# Patient Record
Sex: Male | Born: 1995 | Race: Black or African American | Hispanic: No | Marital: Married | Smoking: Never smoker
Health system: Southern US, Community
[De-identification: ages and names within clinical notes are randomized; demographics above are authoritative.]

## PROBLEM LIST (undated history)

## (undated) DIAGNOSIS — J45909 Unspecified asthma, uncomplicated: Secondary | ICD-10-CM

## (undated) HISTORY — PX: NO PAST SURGERIES: SHX2092

---
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
## 2016-06-22 IMAGING — CR DG HUMERUS 2V *R*
2 series · 2 of 2 positions shown · non-contrast
Comparison: None.

CLINICAL DATA: No known injury, now with decreased range of motion
of the right arm. Initial encounter.

EXAM:
RIGHT HUMERUS - 2+ VIEW

[w humerus ap right *]
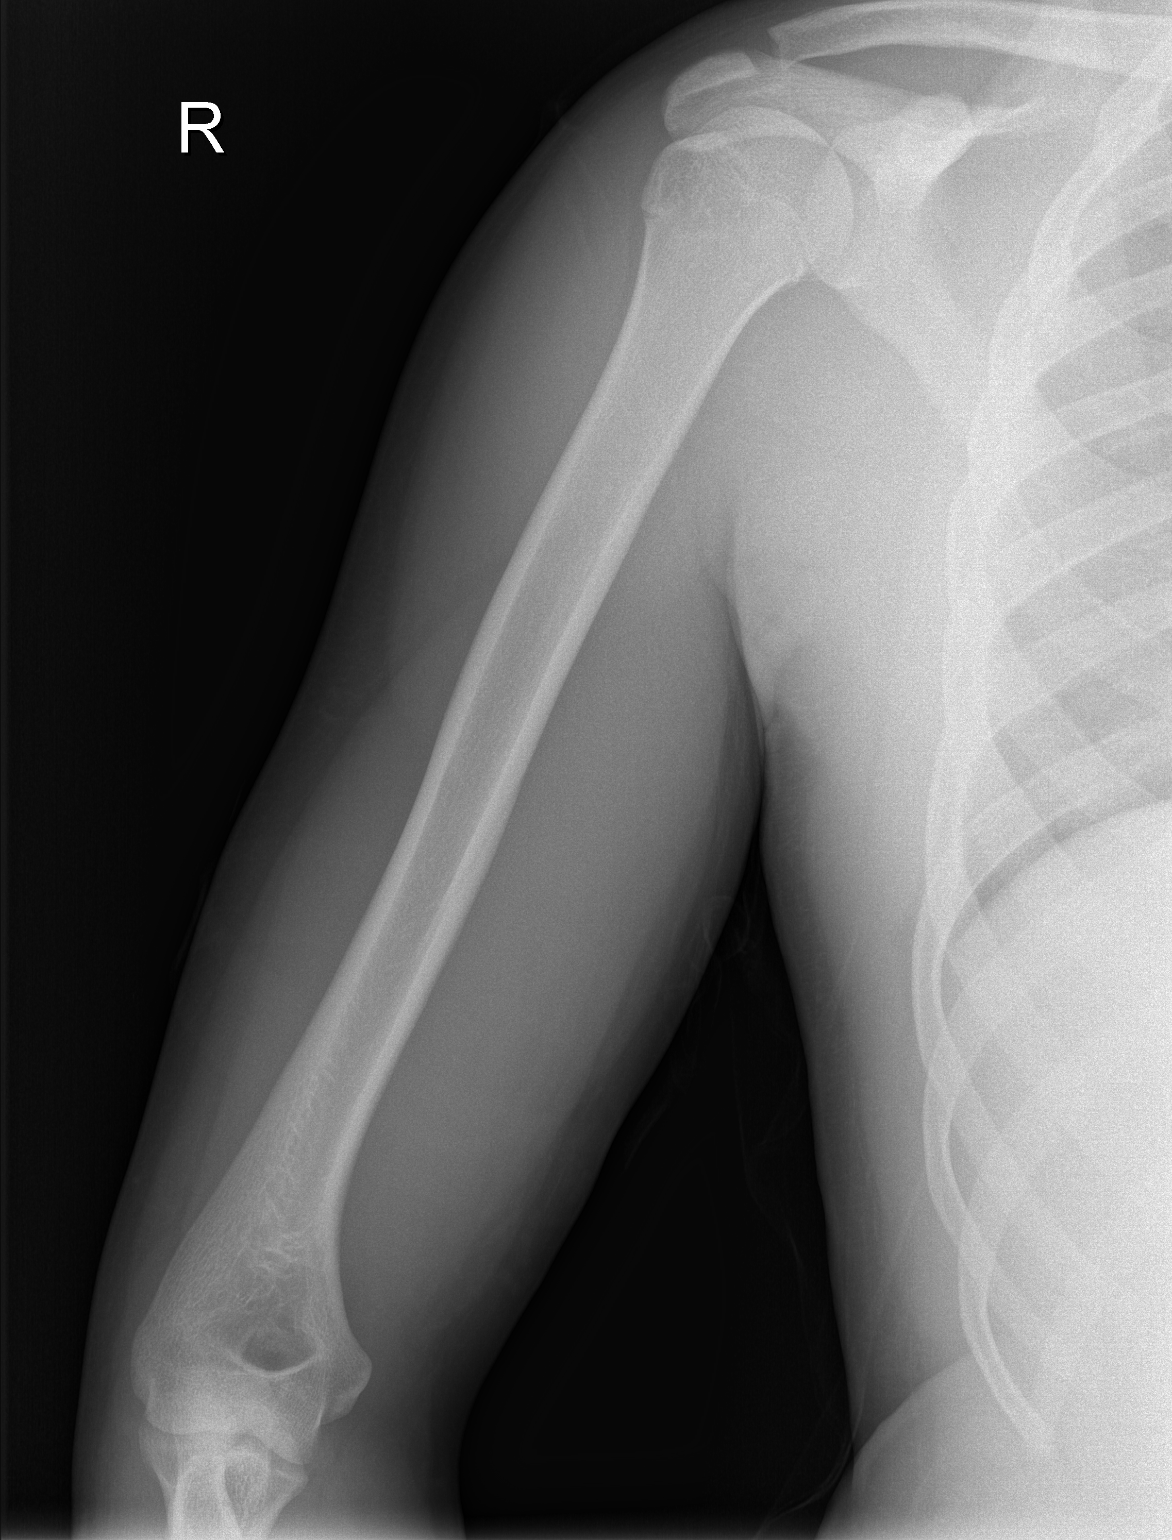

[w humerus lat right *]
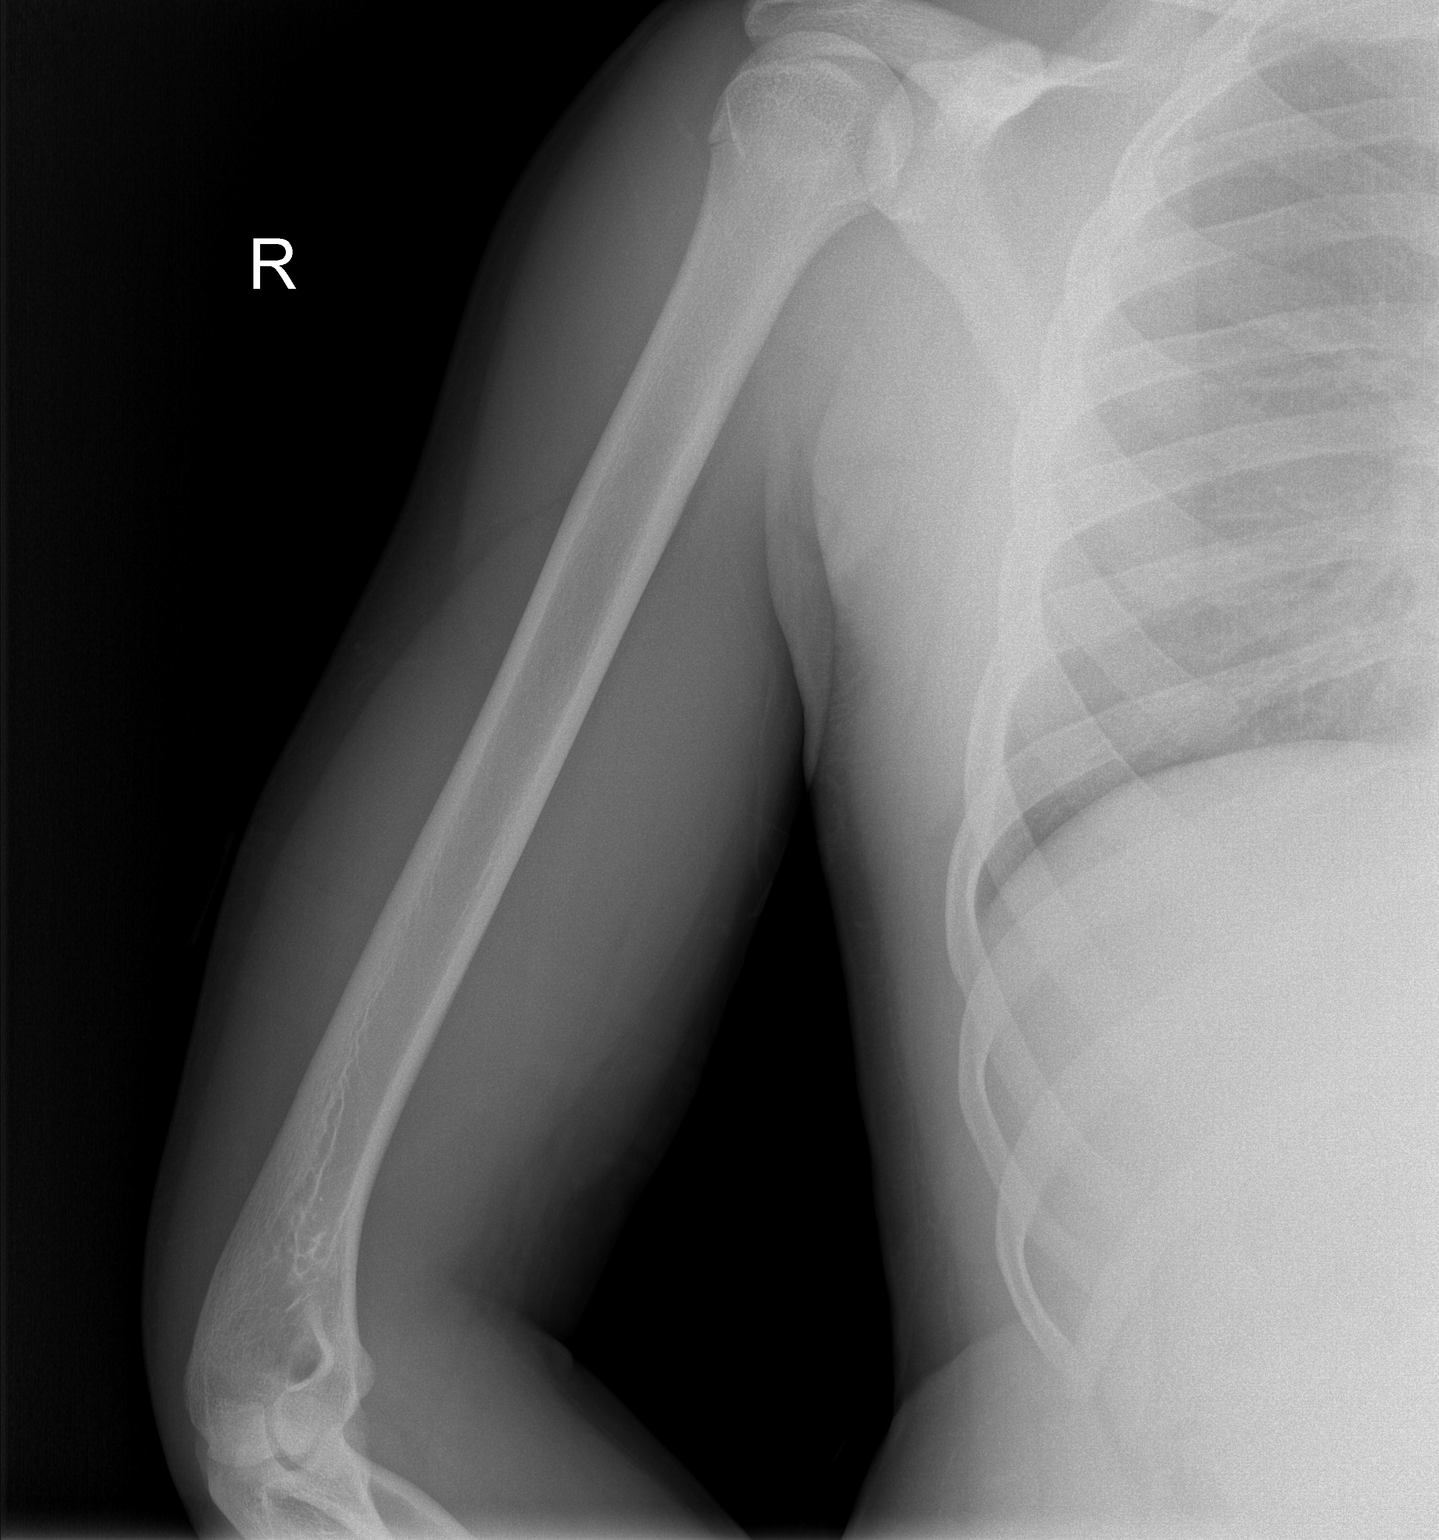

[2 of 2 positions shown; findings below may reference images not displayed]

FINDINGS: No fracture or dislocation. Regional soft tissues appear normal. No
radiopaque foreign body. Limited visualization of the adjacent elbow
and shoulder is normal given obliquity. Limited visualization
adjacent thorax is normal.
IMPRESSION: No acute findings.

## 2020-08-28 ENCOUNTER — Emergency Department (HOSPITAL_BASED_OUTPATIENT_CLINIC_OR_DEPARTMENT_OTHER)
Admission: EM | Admit: 2020-08-28 | Discharge: 2020-08-28 | Disposition: A | Discharge status: Home / Self Care | Payer: PRIVATE HEALTH INSURANCE | Attending: Emergency Medicine | Admitting: Emergency Medicine

## 2020-08-28 ENCOUNTER — Encounter (HOSPITAL_BASED_OUTPATIENT_CLINIC_OR_DEPARTMENT_OTHER): Payer: Self-pay | Admitting: Emergency Medicine

## 2020-08-28 ENCOUNTER — Other Ambulatory Visit: Payer: Self-pay

## 2020-08-28 ENCOUNTER — Emergency Department (HOSPITAL_BASED_OUTPATIENT_CLINIC_OR_DEPARTMENT_OTHER): Payer: PRIVATE HEALTH INSURANCE

## 2020-08-28 DIAGNOSIS — Z7951 Long term (current) use of inhaled steroids: Secondary | ICD-10-CM | POA: Insufficient documentation

## 2020-08-28 DIAGNOSIS — J45909 Unspecified asthma, uncomplicated: Secondary | ICD-10-CM | POA: Diagnosis not present

## 2020-08-28 DIAGNOSIS — R112 Nausea with vomiting, unspecified: Secondary | ICD-10-CM | POA: Diagnosis not present

## 2020-08-28 DIAGNOSIS — R109 Unspecified abdominal pain: Secondary | ICD-10-CM | POA: Insufficient documentation

## 2020-08-28 DIAGNOSIS — R197 Diarrhea, unspecified: Secondary | ICD-10-CM | POA: Insufficient documentation

## 2020-08-28 LAB — COMPREHENSIVE METABOLIC PANEL
ALT: 25 U/L (ref 0–44)
AST: 30 U/L (ref 15–41)
Albumin: 3.6 g/dL (ref 3.5–5.0)
Alkaline Phosphatase: 35 U/L — ABNORMAL LOW (ref 38–126)
Anion gap: 7 (ref 5–15)
BUN: 17 mg/dL (ref 6–20)
CO2: 29 mmol/L (ref 22–32)
Calcium: 8.8 mg/dL — ABNORMAL LOW (ref 8.9–10.3)
Chloride: 103 mmol/L (ref 98–111)
Creatinine, Ser: 1.08 mg/dL (ref 0.61–1.24)
GFR, Estimated: >60 mL/min (ref >60)
Glucose, Bld: 93 mg/dL (ref 70–99)
Potassium: 3.6 mmol/L (ref 3.5–5.1)
Sodium: 139 mmol/L (ref 135–145)
Total Bilirubin: 0.5 mg/dL (ref 0.3–1.2)
Total Protein: 6.5 g/dL (ref 6.5–8.1)

## 2020-08-28 LAB — CBC WITH DIFFERENTIAL/PLATELET
Abs Immature Granulocytes: 0.01 10*3/uL (ref 0.00–0.07)
Basophils Absolute: 0 10*3/uL (ref 0.0–0.1)
Basophils Relative: 1 %
Eosinophils Absolute: 0.2 10*3/uL (ref 0.0–0.5)
Eosinophils Relative: 4 %
HCT: 40.6 % (ref 39.0–52.0)
Hemoglobin: 13.9 g/dL (ref 13.0–17.0)
Immature Granulocytes: 0 %
Lymphocytes Relative: 50 %
Lymphs Abs: 2.2 10*3/uL (ref 0.7–4.0)
MCH: 31.8 pg (ref 26.0–34.0)
MCHC: 34.2 g/dL (ref 30.0–36.0)
MCV: 92.9 fL (ref 80.0–100.0)
Monocytes Absolute: 0.6 10*3/uL (ref 0.1–1.0)
Monocytes Relative: 14 %
Neutro Abs: 1.3 10*3/uL — ABNORMAL LOW (ref 1.7–7.7)
Neutrophils Relative %: 31 %
Platelets: 209 10*3/uL (ref 150–400)
RBC: 4.37 MIL/uL (ref 4.22–5.81)
RDW: 11.8 % (ref 11.5–15.5)
WBC: 4.3 10*3/uL (ref 4.0–10.5)
nRBC: 0 % (ref 0.0–0.2)

## 2020-08-28 LAB — LIPASE, BLOOD: Lipase: 23 U/L (ref 11–51)

## 2020-08-28 MED ORDER — IOHEXOL 300 MG/ML  SOLN
100.0000 mL | Freq: Once | INTRAMUSCULAR | Status: AC
  Administered 2020-08-28: 100 mL via INTRAVENOUS

## 2020-08-28 MED ORDER — ONDANSETRON HCL 4 MG/2ML IJ SOLN
4.0000 mg | Freq: Once | INTRAMUSCULAR | Status: AC
  Administered 2020-08-28: 4 mg via INTRAVENOUS
  Filled 2020-08-28: qty 2

## 2020-08-28 MED ORDER — LACTATED RINGERS IV BOLUS
1000.0000 mL | Freq: Once | INTRAVENOUS | Status: AC
  Administered 2020-08-28: 1000 mL via INTRAVENOUS

## 2020-08-28 NOTE — Discharge Instructions (Signed)
Nausea, Vomiting, and Diarrhea  Hand washing: Wash your hands throughout the day, but especially before and after touching the face, using the restroom, sneezing, coughing, or touching surfaces that have been coughed or sneezed upon. Hydration: Symptoms will be intensified and complicated by dehydration. Dehydration can also extend the duration of symptoms. Drink plenty of fluids and get plenty of rest. You should be drinking at least half a liter of water an hour to stay hydrated. Electrolyte drinks (ex. Gatorade, Powerade, Pedialyte) are also encouraged. You should be drinking enough fluids to make your urine light yellow, almost clear. If this is not the case, you are not drinking enough water. Please note that some of the treatments indicated below will not be effective if you are not adequately hydrated. Diet: Please concentrate on hydration, however, you may introduce food slowly.  Start with a clear liquid diet, progressed to a full liquid diet, and then bland solids as you are able. Pain or fever: Ibuprofen, Naproxen, or Tylenol for pain or fever.  Nausea/vomiting: Things that can help with nausea/vomiting include peppermint/menthol candies, vitamin B12, and ginger. Diarrhea: May use medications such as loperamide (Imodium) or Bismuth subsalicylate (Pepto-Bismol). Follow-up: Follow-up with a primary care provider on this matter. Return: Return should you develop a fever, bloody diarrhea, increased abdominal pain, uncontrolled vomiting, or any other major concerns.  For prescription assistance, may try using prescription discount sites or apps, such as goodrx.com

## 2020-08-28 NOTE — ED Triage Notes (Signed)
Reports having abdominal discomfort after eating mediterranean food on Friday.  Endorses n/v/d yesterday but none today.  Continues to not feel like eating.

## 2020-08-28 NOTE — ED Provider Notes (Addendum)
[facility] EMERGENCY DEPARTMENT Provider Note   CSN: [id number] Arrival date & time: 08/28/20  2016     History Chief Complaint  Patient presents with  . Abdominal Pain    Leroy Adkins is a 24 y.o. male.  HPI      Leroy Adkins is a 24 y.o. male, with a history of asthma, presenting to the ED with nausea, vomiting, diarrhea, and abdominal discomfort for the last 2 days.  Patient endorses multiple episodes of nausea and nonbloody nonbilious emesis as well as several episodes of diarrhea.  Right-sided abdominal cramping/discomfort, intermittent, nonradiating, moderate.  Accompanied by loss of appetite.  Denies fever/chills, hematochezia/melena, chest pain, shortness of breath, cough, groin pain/swelling, urinary symptoms, or any other complaints.  Past Medical History:  Diagnosis Date  . Asthma     Patient Active Problem List   Diagnosis Date Noted  . Ingrown nail 02/12/2014  . Pain in toe of right foot 02/12/2014  . Onychomycosis 02/12/2014    History reviewed. No pertinent surgical history.     No family history on file.  Social History   Tobacco Use  . Smoking status: Never Smoker  . Smokeless tobacco: Never Used  Substance Use Topics  . Alcohol use: No  . Drug use: No    Home Medications Prior to Admission medications   Medication Sig Start Date End Date Taking? Authorizing Provider  albuterol (PROVENTIL) (5 MG/ML) 0.5% nebulizer solution Take 2.5 mg by nebulization every 6 (six) hours as needed for wheezing or shortness of breath.    [provider]  Fluticasone-Salmeterol (ADVAIR DISKUS) 100-50 MCG/DOSE AEPB Inhale 1 puff into the lungs 2 (two) times daily.    [provider]  HYDROcodone-acetaminophen (NORCO/VICODIN) 5-325 MG per tablet Take 2 tablets by mouth every 4 (four) hours as needed. 07/18/14   Elson Areas, PA-C  ibuprofen (ADVIL,MOTRIN) 800 MG tablet Take 1 tablet (800 mg total) by mouth 3 (three) times daily.  07/18/14   Elson Areas, PA-C  naproxen (NAPROSYN) 500 MG tablet Take 1 tablet (500 mg total) by mouth 2 (two) times daily. 03/25/15   Renne Crigler, PA-C  terbinafine (LAMISIL) 250 MG tablet Take 1 tablet (250 mg total) by mouth daily. 02/12/14   Sheard, Myeong O, DPM    Allergies    Penicillins  Review of Systems   Review of Systems  Constitutional: Negative for chills, diaphoresis and fever.  Respiratory: Negative for cough and shortness of breath.   Cardiovascular: Negative for chest pain.  Gastrointestinal: Positive for abdominal pain, diarrhea, nausea and vomiting. Negative for blood in stool.  Genitourinary: Negative for dysuria, flank pain, hematuria, scrotal swelling and testicular pain.  Musculoskeletal: Negative for back pain.  All other systems reviewed and are negative.   Physical Exam Updated Vital Signs BP 137/64 (BP Location: Right Arm)   Pulse (!) 56   Temp 98 F (36.7 C) (Oral)   Resp 18   Ht 6\' 1"  (1.854 m)   Wt 104.2 kg   SpO2 98%   BMI 30.31 kg/m   Physical Exam Vitals and nursing note reviewed.  Constitutional:      General: He is not in acute distress.    Appearance: He is well-developed. He is not diaphoretic.  HENT:     Head: Normocephalic and atraumatic.     Mouth/Throat:     Mouth: Mucous membranes are moist.     Pharynx: Oropharynx is clear.  Eyes:     Conjunctiva/sclera: Conjunctivae normal.  Cardiovascular:     Rate and Rhythm: Normal rate and regular rhythm.     Pulses: Normal pulses.          Radial pulses are 2+ on the right side and 2+ on the left side.  Pulmonary:     Effort: Pulmonary effort is normal. No respiratory distress.  Abdominal:     General: Bowel sounds are increased.     Palpations: Abdomen is soft.     Tenderness: There is abdominal tenderness in the right lower quadrant. There is no guarding.  Musculoskeletal:     Cervical back: Neck supple.  Skin:    General: Skin is warm and dry.  Neurological:     Mental  Status: He is alert.  Psychiatric:        Mood and Affect: Mood and affect normal.        Speech: Speech normal.        Behavior: Behavior normal.     ED Results / Procedures / Treatments   Labs (all labs ordered are listed, but only abnormal results are displayed) Labs Reviewed  CBC WITH DIFFERENTIAL/PLATELET - Abnormal; Notable for the following components:      Result Value   Neutro Abs 1.3 (*)    All other components within normal limits  COMPREHENSIVE METABOLIC PANEL - Abnormal; Notable for the following components:   Calcium 8.8 (*)    Alkaline Phosphatase 35 (*)    All other components within normal limits  LIPASE, BLOOD    EKG None  Radiology CT ABDOMEN PELVIS W CONTRAST  Result Date: 08/28/2020 CLINICAL DATA:  Right lower quadrant pain. Concern for acute appendicitis. EXAM: CT ABDOMEN AND PELVIS WITH CONTRAST TECHNIQUE: Multidetector CT imaging of the abdomen and pelvis was performed using the standard protocol following bolus administration of intravenous contrast. CONTRAST:  OMNIPAQUE IOHEXOL 300 MG/ML  SOLN COMPARISON:  None. FINDINGS: Lower chest: The lung bases are clear. The heart size is normal. Hepatobiliary: The liver is normal. Cholelithiasis without acute inflammation.There is no biliary ductal dilation. Pancreas: Normal contours without ductal dilatation. No peripancreatic fluid collection. Spleen: Unremarkable. Adrenals/Urinary Tract: --Adrenal glands: Unremarkable. --Right kidney/ureter: No hydronephrosis or radiopaque kidney stones. --Left kidney/ureter: No hydronephrosis or radiopaque kidney stones. --Urinary bladder: Unremarkable. Stomach/Bowel: --Stomach/Duodenum: The stomach is significantly distended. --Small bowel: Unremarkable. --Colon: Unremarkable. --Appendix: Normal. Vascular/Lymphatic: Normal course and caliber of the major abdominal vessels. --No retroperitoneal lymphadenopathy. --No mesenteric lymphadenopathy. --No pelvic or inguinal  lymphadenopathy. Reproductive: Unremarkable Other: No ascites or free air. The abdominal wall is normal. Musculoskeletal. No acute displaced fractures. IMPRESSION: 1. No acute abdominopelvic abnormality. Specifically, the appendix is normal. 2. Cholelithiasis without acute inflammation. 3. Significantly distended stomach. Electronically Signed   By: Katherine Mantle M.D.   On: 08/28/2020 22:58    Procedures Procedures (including critical care time)  Medications Ordered in ED Medications  ondansetron (ZOFRAN) injection 4 mg (4 mg Intravenous Given 08/28/20 2048)  lactated ringers bolus 1,000 mL (0 mLs Intravenous Stopped 08/28/20 2317)  iohexol (OMNIPAQUE) 300 MG/ML solution 100 mL (100 mLs Intravenous Contrast Given 08/28/20 2206)    ED Course  I have reviewed the triage vital signs and the nursing notes.  Pertinent labs & imaging results that were available during my care of the patient were reviewed by me and considered in my medical decision making (see chart for details).  Clinical Course as of Nov 22 0001  Wynelle Link Aug 28, 2020  2100 Patient states he is a very fit person and  a pulse rate in this range would not be unusual.  Pulse Rate(!): 49 [SJ]  2305 No upper abdominal pain, tenderness, or obvious distension.  CT ABDOMEN PELVIS W CONTRAST [SJ]    Clinical Course User Index [SJ] Yamari Ventola C, PA-C   MDM Rules/Calculators/A&P                          Patient presents with instances of nausea, vomiting, and diarrhea over the past couple days.  Some intermittent abdominal discomfort. Patient is nontoxic appearing, afebrile, not tachycardic, not tachypneic, not hypotensive, maintains excellent SPO2 on room air, and is in no apparent distress.    I reviewed and interpreted the patient's labs and radiological studies. No leukocytosis.  Lab work overall reassuring. CT without acute findings pertinent to the patient's current complaint.,  Although all noted findings were discussed  with the patient.  The patient was given instructions for home care as well as return precautions. Patient voices understanding of these instructions, accepts the plan, and is comfortable with discharge.  Patient was offered prescriptions for symptom management at time of discharge, but declined.  Final Clinical Impression(s) / ED Diagnoses Final diagnoses:  Nausea vomiting and diarrhea    Rx / DC Orders ED Discharge Orders    None       Concepcion Living 08/29/20 0001    Anselm Pancoast, PA-C 08/29/20 0002    Charlynne Pander, MD 08/31/20 [phone]

## 2021-04-03 ENCOUNTER — Other Ambulatory Visit: Payer: Self-pay

## 2021-04-03 ENCOUNTER — Emergency Department (HOSPITAL_BASED_OUTPATIENT_CLINIC_OR_DEPARTMENT_OTHER)
Admission: EM | Admit: 2021-04-03 | Discharge: 2021-04-03 | Disposition: A | Discharge status: Home / Self Care | Payer: PRIVATE HEALTH INSURANCE | Attending: Emergency Medicine | Admitting: Emergency Medicine

## 2021-04-03 ENCOUNTER — Encounter (HOSPITAL_BASED_OUTPATIENT_CLINIC_OR_DEPARTMENT_OTHER): Payer: Self-pay | Admitting: Emergency Medicine

## 2021-04-03 DIAGNOSIS — Z20822 Contact with and (suspected) exposure to covid-19: Secondary | ICD-10-CM | POA: Insufficient documentation

## 2021-04-03 DIAGNOSIS — J45909 Unspecified asthma, uncomplicated: Secondary | ICD-10-CM | POA: Diagnosis not present

## 2021-04-03 DIAGNOSIS — Z7951 Long term (current) use of inhaled steroids: Secondary | ICD-10-CM | POA: Insufficient documentation

## 2021-04-03 DIAGNOSIS — R059 Cough, unspecified: Secondary | ICD-10-CM | POA: Diagnosis present

## 2021-04-03 DIAGNOSIS — B349 Viral infection, unspecified: Secondary | ICD-10-CM | POA: Diagnosis not present

## 2021-04-03 DIAGNOSIS — Z28310 Unvaccinated for covid-19: Secondary | ICD-10-CM | POA: Insufficient documentation

## 2021-04-03 DIAGNOSIS — R630 Anorexia: Secondary | ICD-10-CM | POA: Insufficient documentation

## 2021-04-03 LAB — RESP PANEL BY RT-PCR (FLU A&B, COVID) ARPGX2
Influenza A by PCR: NEGATIVE
Influenza B by PCR: NEGATIVE
SARS Coronavirus 2 by RT PCR: NEGATIVE

## 2021-04-03 MED ORDER — ALBUTEROL SULFATE HFA 108 (90 BASE) MCG/ACT IN AERS
2.0000 | INHALATION_SPRAY | RESPIRATORY_TRACT | Status: DC | PRN
  Administered 2021-04-03: 2 via RESPIRATORY_TRACT
  Filled 2021-04-03: qty 6.7

## 2021-04-03 NOTE — ED Provider Notes (Signed)
[facility] EMERGENCY DEPARTMENT Provider Note   CSN: [id number] Arrival date & time: 04/03/21  1127     History Chief Complaint  Patient presents with   Cough    Leroy Adkins is a 25 y.o. male.  The history is provided by the patient. No language interpreter was used.  Cough  25 year old male significant history of asthma who presents with cold symptoms.  Patient report for the past 3 days he has had some generalized fatigue, dry cough, decrease in appetite, and shortness of breath.  Symptom is mild in severity but patient has not been vaccinated for COVID-19 and would like to be tested.  He denies any fever, loss of taste or smell, nausea vomiting or diarrhea abdominal pain or back pain.  He does have history of asthma but have not been using his inhaler.  He is anxious to get back to work  Past Medical History:  Diagnosis Date   Asthma     Patient Active Problem List   Diagnosis Date Noted   Ingrown nail 02/12/2014   Pain in toe of right foot 02/12/2014   Onychomycosis 02/12/2014    History reviewed. No pertinent surgical history.     History reviewed. No pertinent family history.  Social History   Tobacco Use   Smoking status: Never   Smokeless tobacco: Never  Substance Use Topics   Alcohol use: No   Drug use: No    Home Medications Prior to Admission medications   Medication Sig Start Date End Date Taking? Authorizing Provider  albuterol (PROVENTIL) (5 MG/ML) 0.5% nebulizer solution Take 2.5 mg by nebulization every 6 (six) hours as needed for wheezing or shortness of breath.    [provider]  Fluticasone-Salmeterol (ADVAIR DISKUS) 100-50 MCG/DOSE AEPB Inhale 1 puff into the lungs 2 (two) times daily.    [provider]  HYDROcodone-acetaminophen (NORCO/VICODIN) 5-325 MG per tablet Take 2 tablets by mouth every 4 (four) hours as needed. 07/18/14   Elson Areas, PA-C  ibuprofen (ADVIL,MOTRIN) 800 MG tablet Take 1 tablet (800  mg total) by mouth 3 (three) times daily. 07/18/14   Elson Areas, PA-C  naproxen (NAPROSYN) 500 MG tablet Take 1 tablet (500 mg total) by mouth 2 (two) times daily. 03/25/15   Renne Crigler, PA-C  terbinafine (LAMISIL) 250 MG tablet Take 1 tablet (250 mg total) by mouth daily. 02/12/14   Sheard, Myeong O, DPM    Allergies    Penicillins  Review of Systems   Review of Systems  Respiratory:  Positive for cough.   All other systems reviewed and are negative.  Physical Exam Updated Vital Signs BP (!) 144/64 (BP Location: Left Arm)   Pulse 65   Temp 98.3 F (36.8 C) (Oral)   Resp 20   Ht 6\' 4"  (1.93 m)   Wt 108.9 kg   SpO2 99%   BMI 29.21 kg/m   Physical Exam Vitals and nursing note reviewed.  Constitutional:      General: He is not in acute distress.    Appearance: He is well-developed.  HENT:     Head: Atraumatic.     Right Ear: Tympanic membrane normal.     Left Ear: Tympanic membrane normal.     Nose: Nose normal.     Mouth/Throat:     Mouth: Mucous membranes are moist.  Eyes:     Conjunctiva/sclera: Conjunctivae normal.  Cardiovascular:     Rate and Rhythm: Normal rate and regular rhythm.  Pulses: Normal pulses.     Heart sounds: Normal heart sounds.  Pulmonary:     Effort: Pulmonary effort is normal.     Breath sounds: Normal breath sounds. No wheezing, rhonchi or rales.  Abdominal:     Palpations: Abdomen is soft.     Tenderness: There is no abdominal tenderness.  Musculoskeletal:     Cervical back: Normal range of motion and neck supple.  Skin:    Findings: No rash.  Neurological:     Mental Status: He is alert. Mental status is at baseline.    ED Results / Procedures / Treatments   Labs (all labs ordered are listed, but only abnormal results are displayed) Labs Reviewed  RESP PANEL BY RT-PCR (FLU A&B, COVID) [id number]    EKG None  Radiology No results found.  Procedures Procedures   Medications Ordered in ED Medications - No data to  display  ED Course  I have reviewed the triage vital signs and the nursing notes.  Pertinent labs & imaging results that were available during my care of the patient were reviewed by me and considered in my medical decision making (see chart for details).    MDM Rules/Calculators/A&P                          BP (!) 144/64 (BP Location: Left Arm)   Pulse 65   Temp 98.3 F (36.8 C) (Oral)   Resp 20   Ht 6\' 4"  (1.93 m)   Wt 108.9 kg   SpO2 99%   BMI 29.21 kg/m   Final Clinical Impression(s) / ED Diagnoses Final diagnoses:  Viral illness    Rx / DC Orders ED Discharge Orders     None      2:17 PM Patient here with cold symptoms for the past 3 days.  Flu and COVID test today is negative.  Patient overall well-appearing no hypoxia, breathing normally and no obvious wheezes.  He does have history of asthma and states that he needs inhaler, will provide inhaler.  Patient otherwise stable for discharge.  Lungs are clear doubt pneumonia.  Leroy Adkins was evaluated in Emergency Department on 04/03/2021 for the symptoms described in the history of present illness. He was evaluated in the context of the global COVID-19 pandemic, which necessitated consideration that the patient might be at risk for infection with the SARS-CoV-2 virus that causes COVID-19. Institutional protocols and algorithms that pertain to the evaluation of patients at risk for COVID-19 are in a state of rapid change based on information released by regulatory bodies including the CDC and federal and state organizations. These policies and algorithms were followed during the patient's care in the ED.    04/05/2021, PA-C 04/03/21 1419    04/05/21, MD 04/03/21 [phone]

## 2021-04-03 NOTE — Discharge Instructions (Addendum)
Your flu and COVID test today are negative.  Symptoms are suggestive of a viral illness.  Use albuterol inhaler as needed.  Return if you have any concern.

## 2021-04-03 NOTE — ED Triage Notes (Signed)
Pt c/o cough, sore throat, bodyaches, and SOB since Saturday. Resp e/u in triage 98% RA

## 2022-03-20 ENCOUNTER — Other Ambulatory Visit: Payer: Self-pay

## 2022-03-20 ENCOUNTER — Encounter (HOSPITAL_BASED_OUTPATIENT_CLINIC_OR_DEPARTMENT_OTHER): Payer: Self-pay | Admitting: Emergency Medicine

## 2022-03-20 ENCOUNTER — Emergency Department (HOSPITAL_BASED_OUTPATIENT_CLINIC_OR_DEPARTMENT_OTHER)
Admission: EM | Admit: 2022-03-20 | Discharge: 2022-03-20 | Disposition: A | Discharge status: Home / Self Care | Payer: BLUE CROSS/BLUE SHIELD | Attending: Emergency Medicine | Admitting: Emergency Medicine

## 2022-03-20 DIAGNOSIS — Z7952 Long term (current) use of systemic steroids: Secondary | ICD-10-CM | POA: Insufficient documentation

## 2022-03-20 DIAGNOSIS — R590 Localized enlarged lymph nodes: Secondary | ICD-10-CM | POA: Diagnosis not present

## 2022-03-20 DIAGNOSIS — J4531 Mild persistent asthma with (acute) exacerbation: Secondary | ICD-10-CM | POA: Diagnosis not present

## 2022-03-20 DIAGNOSIS — R0602 Shortness of breath: Secondary | ICD-10-CM | POA: Diagnosis present

## 2022-03-20 MED ORDER — ALBUTEROL SULFATE HFA 108 (90 BASE) MCG/ACT IN AERS: 2.0000 | INHALATION_SPRAY | RESPIRATORY_TRACT | 1 refills | Status: DC | PRN

## 2022-03-20 MED ORDER — ALBUTEROL SULFATE (2.5 MG/3ML) 0.083% IN NEBU
INHALATION_SOLUTION | RESPIRATORY_TRACT | Status: AC
  Filled 2022-03-20: qty 3

## 2022-03-20 MED ORDER — IPRATROPIUM-ALBUTEROL 0.5-2.5 (3) MG/3ML IN SOLN
3.0000 mL | RESPIRATORY_TRACT | Status: AC
  Administered 2022-03-20: 3 mL via RESPIRATORY_TRACT

## 2022-03-20 MED ORDER — ALBUTEROL SULFATE (2.5 MG/3ML) 0.083% IN NEBU
2.5000 mg | INHALATION_SOLUTION | Freq: Once | RESPIRATORY_TRACT | Status: AC
  Administered 2022-03-20: 2.5 mg via RESPIRATORY_TRACT

## 2022-03-20 MED ORDER — PREDNISONE 10 MG PO TABS: 40.0000 mg | ORAL_TABLET | Freq: Every day | ORAL | 0 refills | Status: DC

## 2022-03-20 MED ORDER — AZITHROMYCIN 250 MG PO TABS: 250.0000 mg | ORAL_TABLET | Freq: Every day | ORAL | 0 refills | Status: DC

## 2022-03-20 MED ORDER — IPRATROPIUM-ALBUTEROL 0.5-2.5 (3) MG/3ML IN SOLN
RESPIRATORY_TRACT | Status: AC
  Filled 2022-03-20: qty 3

## 2022-03-20 NOTE — ED Provider Notes (Signed)
[facility] EMERGENCY DEPARTMENT Provider Note   CSN: [id number] Arrival date & time: 03/20/22  5170     History  Chief Complaint  Patient presents with   URI    Leroy Adkins is a 26 y.o. male.  The history is provided by the patient.  URI Leroy Adkins is a 26 y.o. male who presents to the Emergency Department complaining of short of breath.  He has a history of asthma and states that he has been experiencing increased asthma issues since staying in his mother's basement the last few months.  He has been experiencing increased shortness of breath and has been needing to use his albuterol inhaler more frequently.  Last night his symptoms became severe and he went upstairs to try and sleep.  He has associated dry cough.  He does report 2 days of headaches.  He also reports 3 days of pain to the left angle of the mandible that is worse when he lays on that area.  No significant ear pain, sore throat, fever, nausea, vomiting.  No known sick contacts.     Home Medications Prior to Admission medications   Medication Sig Start Date End Date Taking? Authorizing Provider  albuterol (VENTOLIN HFA) 108 (90 Base) MCG/ACT inhaler Inhale into the lungs every 6 (six) hours as needed for wheezing or shortness of breath.   Yes [provider]  albuterol (VENTOLIN HFA) 108 (90 Base) MCG/ACT inhaler Inhale 2 puffs into the lungs every 4 (four) hours as needed for wheezing or shortness of breath. 03/20/22  Yes Tilden Fossa, MD  azithromycin (ZITHROMAX) 250 MG tablet Take 1 tablet (250 mg total) by mouth daily. Take first 2 tablets together, then 1 every day until finished. 03/20/22  Yes Tilden Fossa, MD  predniSONE (DELTASONE) 10 MG tablet Take 4 tablets (40 mg total) by mouth daily. 03/20/22  Yes Tilden Fossa, MD      Allergies    Penicillins    Review of Systems   Review of Systems  All other systems reviewed and are negative.   Physical Exam Updated Vital Signs BP (!)  142/80   Pulse (!) 59   Temp 97.7 F (36.5 C) (Oral)   Resp 16   Ht 6\' 4"  (1.93 m)   Wt 108.9 kg   SpO2 97%   BMI 29.22 kg/m  Physical Exam Vitals and nursing note reviewed.  Constitutional:      Appearance: He is well-developed.  HENT:     Head: Normocephalic and atraumatic.     Comments: There is tenderness to palpation and one enlarged lymph node at the left lateral neck just posterior to the angle of the mandible.  No mastoid tenderness or swelling.  No swelling in the posterior oropharynx.  Good dentition.    Right Ear: Tympanic membrane normal.     Left Ear: Tympanic membrane normal.  Cardiovascular:     Rate and Rhythm: Normal rate and regular rhythm.     Heart sounds: No murmur heard. Pulmonary:     Effort: Pulmonary effort is normal. No respiratory distress.     Comments: Decreased air movement bilaterally Musculoskeletal:        General: No tenderness.  Skin:    General: Skin is warm and dry.  Neurological:     Mental Status: He is alert and oriented to person, place, and time.  Psychiatric:        Behavior: Behavior normal.     ED Results / Procedures / Treatments  Labs (all labs ordered are listed, but only abnormal results are displayed) Labs Reviewed - No data to display  EKG None  Radiology No results found.  Procedures Procedures    Medications Ordered in ED Medications  ipratropium-albuterol (DUONEB) 0.5-2.5 (3) MG/3ML nebulizer solution 3 mL (3 mLs Nebulization Given 03/20/22 0524)  albuterol (PROVENTIL) (2.5 MG/3ML) 0.083% nebulizer solution 2.5 mg (2.5 mg Nebulization Given 03/20/22 0524)    ED Course/ Medical Decision Making/ A&P                           Medical Decision Making Risk Prescription drug management.   Patient with history of asthma here for evaluation of shortness of breath as well as pain to the left side of his neck, face.  In terms of his asthma, patient with decreased air movement, scattered wheezes on ED  presentation without respiratory distress.  He was treated with 1 nebulizer with resolution of his symptoms.  Repeat lung exam with good air movement bilaterally, no respiratory distress.  In terms of his asthma we will treat with prednisone burst.  Suspect there is an allergic component as well, recommend that he start OTC allergy medication.  In terms of the pain to his left neck/face-no evidence of mastoiditis, otitis media, dental infection.  Given local tenderness and pain will start antibiotics for possible early infection.  He does have a penicillin allergy.  Discussed importance of outpatient follow-up as well as return precautions for any progressive or new concerning symptoms in terms of his pain.       Final Clinical Impression(s) / ED Diagnoses Final diagnoses:  Mild persistent asthma with exacerbation  Cervical lymphadenopathy    Rx / DC Orders ED Discharge Orders          Ordered    predniSONE (DELTASONE) 10 MG tablet  Daily        03/20/22 0610    albuterol (VENTOLIN HFA) 108 (90 Base) MCG/ACT inhaler  Every 4 hours PRN        03/20/22 0610    azithromycin (ZITHROMAX) 250 MG tablet  Daily        03/20/22 0610              Tilden Fossa, MD 03/20/22 [phone]

## 2022-03-20 NOTE — Discharge Instructions (Signed)
You can take Tylenol or ibuprofen, available over-the-counter according label instructions as needed for pain.  You may start taking Zyrtec(cetirizine), available over-the-counter for allergy relief.

## 2022-03-20 NOTE — ED Triage Notes (Addendum)
Pt c/o shob, left side of face swollen and cough

## 2022-08-03 IMAGING — CT CT ABD-PELV W/ CM
2 of 4 series · 16 of 46 positions shown, 18 images · IV contrast (Omnipaque)
Comparison: None.

CLINICAL DATA: Right lower quadrant pain. Concern for acute
appendicitis.

EXAM:
CT ABDOMEN AND PELVIS WITH CONTRAST
TECHNIQUE: Multidetector CT imaging of the abdomen and pelvis was performed
using the standard protocol following bolus administration of
intravenous contrast.
CONTRAST:  100mL OMNIPAQUE IOHEXOL 300 MG/ML  SOLN

[Series 2: axial st · axial · 0.90mm/px · z∈[+648,+1058]mm · 13 of 90 slices shown, 15 images]
[im 4/90  soft-tissue]
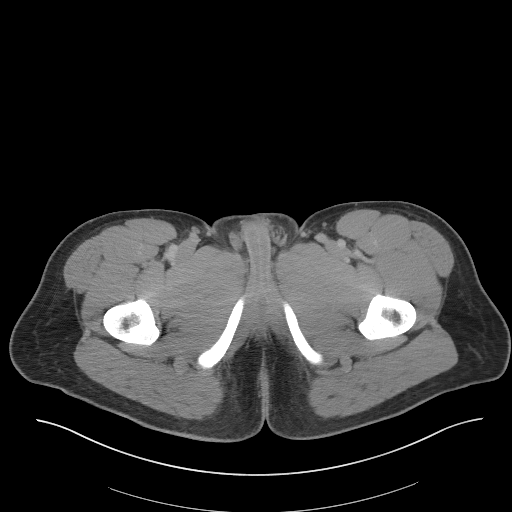
[im 4/90  bone]
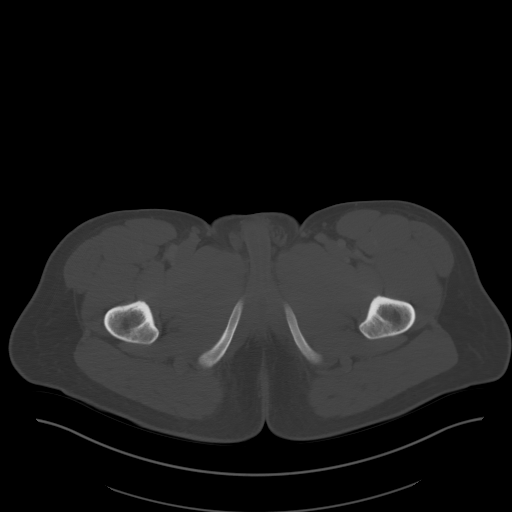
[im 12/90  soft-tissue]
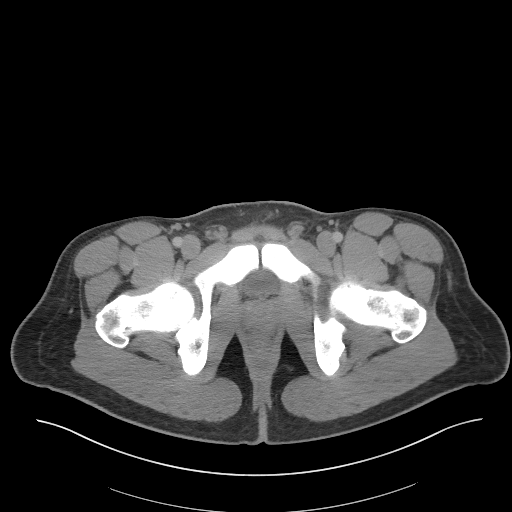
[im 19/90  soft-tissue]
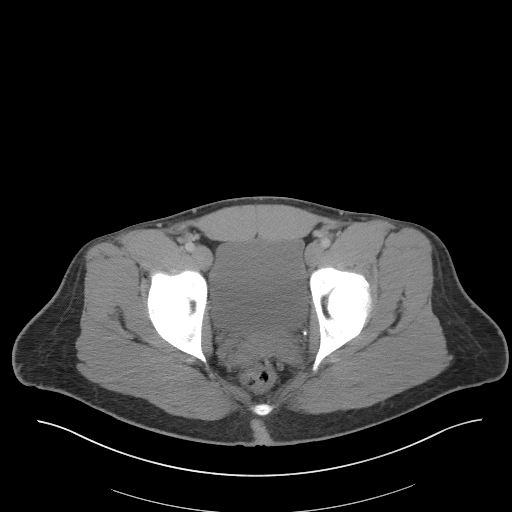
[im 26/90  soft-tissue]
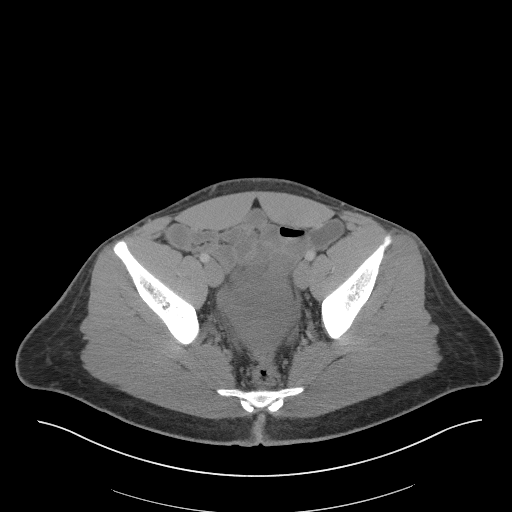
[im 30/90  soft-tissue]
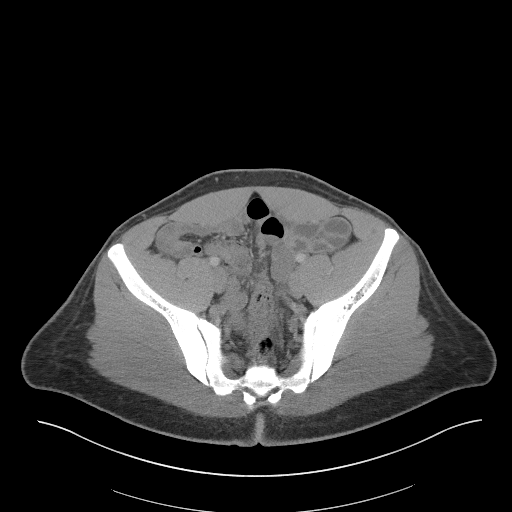
[im 38/90  soft-tissue]
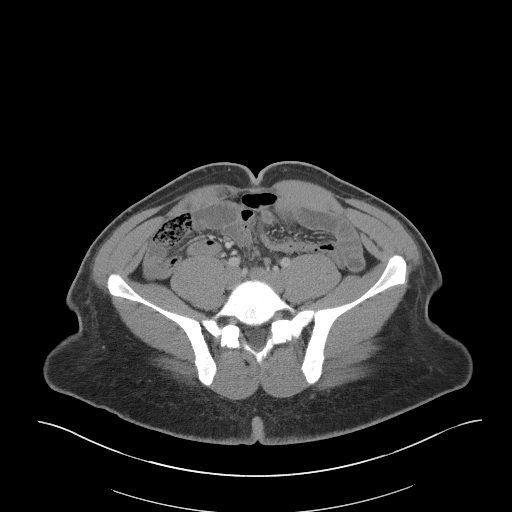
[im 45/90  soft-tissue]
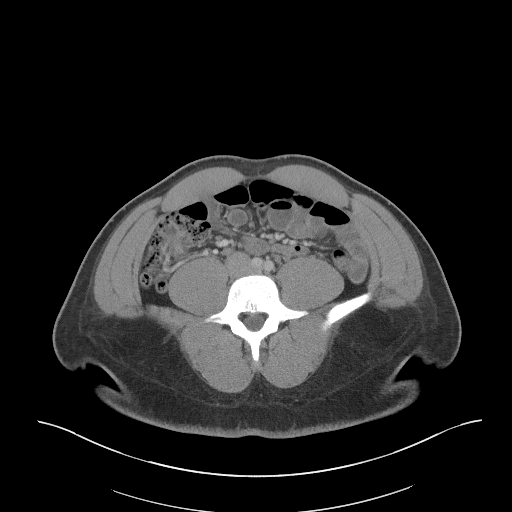
[im 52/90  soft-tissue]
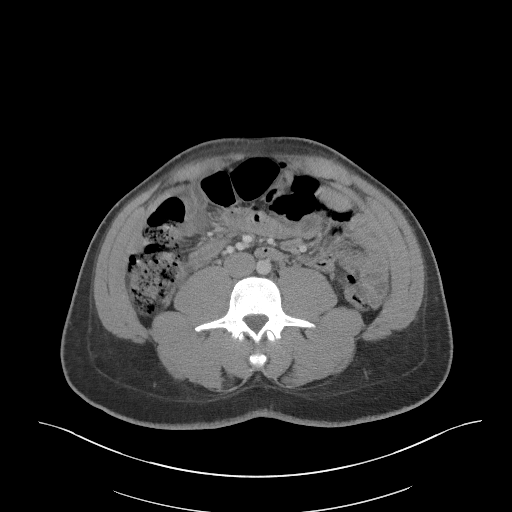
[im 60/90  soft-tissue]
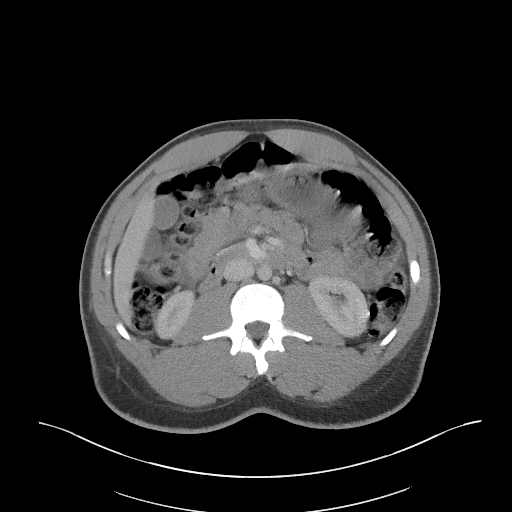
[im 60/90  bone]
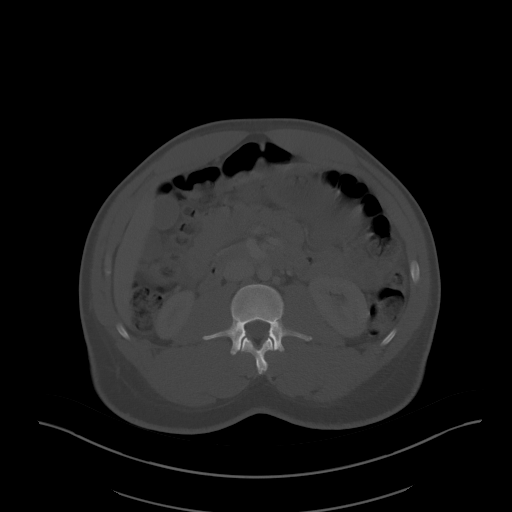
[im 64/90  soft-tissue]
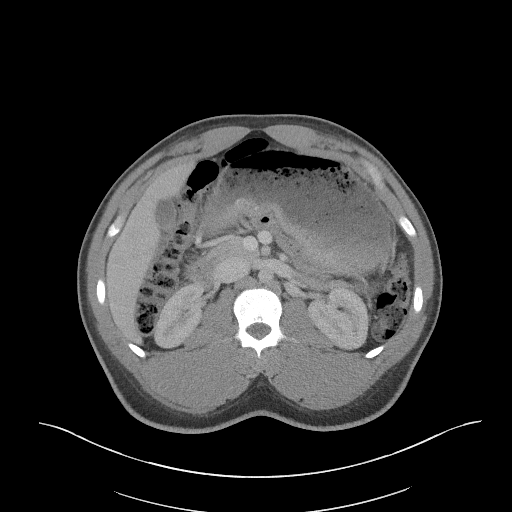
[im 71/90  soft-tissue]
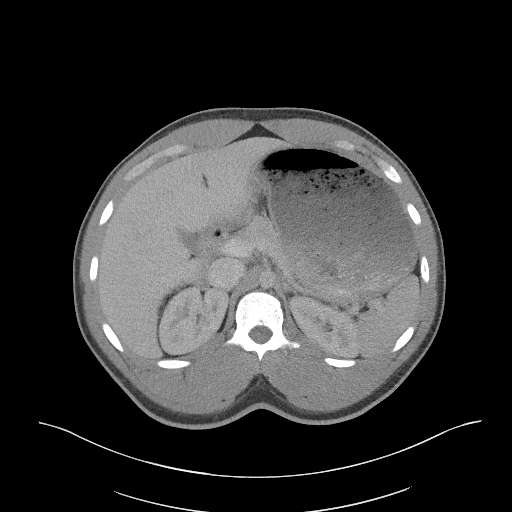
[im 78/90  soft-tissue]
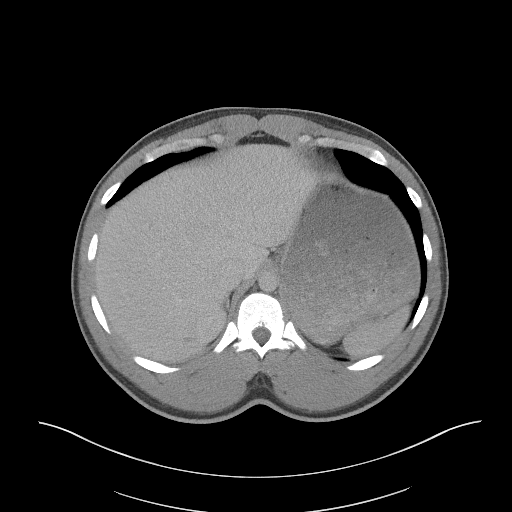
[im 86/90  soft-tissue]
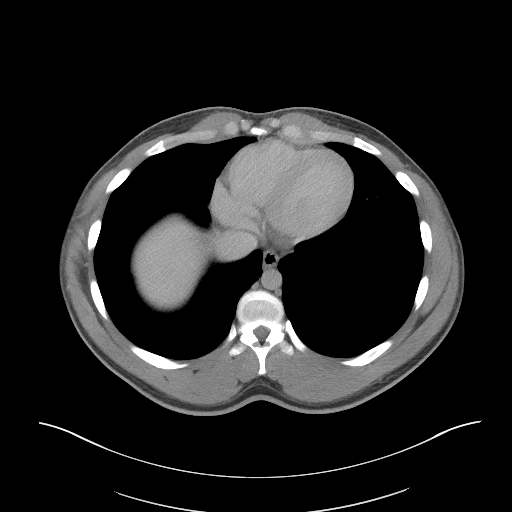

[Series 5: coronal st · coronal · 0.79mm/px · 3 of 106 slices shown]
[im 36/106  soft-tissue]
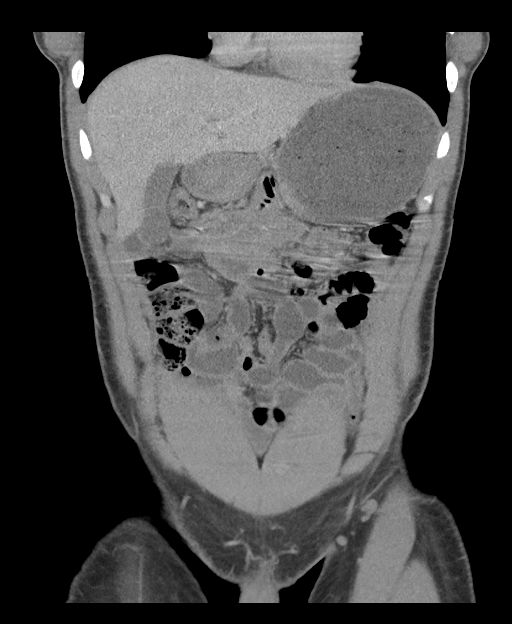
[im 47/106  soft-tissue]
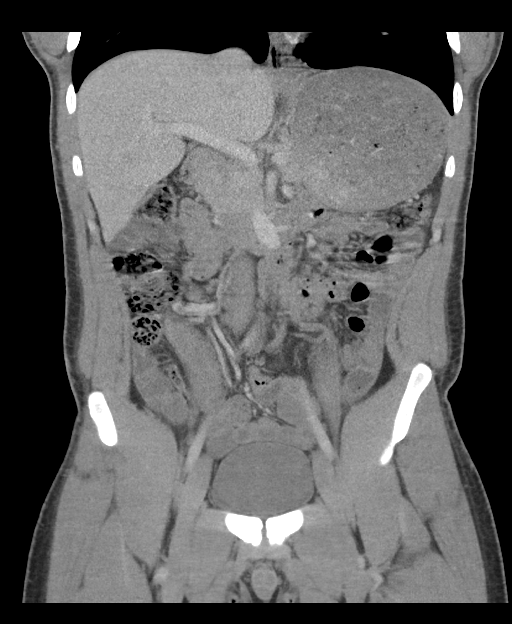
[im 59/106  soft-tissue]
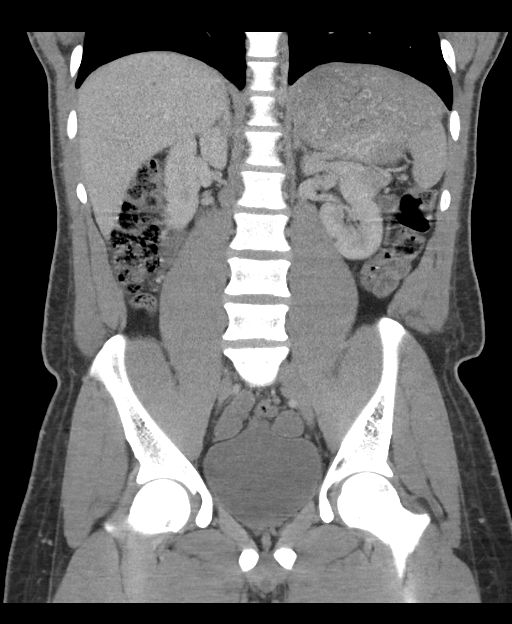

[16 of 46 positions shown; findings below may reference images not displayed]

FINDINGS: Lower chest: The lung bases are clear. The heart size is normal.

Hepatobiliary: The liver is normal. Cholelithiasis without acute
inflammation.There is no biliary ductal dilation.

Pancreas: Normal contours without ductal dilatation. No
peripancreatic fluid collection.

Spleen: Unremarkable.

Adrenals/Urinary Tract:

--Adrenal glands: Unremarkable.

--Right kidney/ureter: No hydronephrosis or radiopaque kidney
stones.

--Left kidney/ureter: No hydronephrosis or radiopaque kidney stones.

--Urinary bladder: Unremarkable.

Stomach/Bowel:

--Stomach/Duodenum: The stomach is significantly distended.

--Small bowel: Unremarkable.

--Colon: Unremarkable.

--Appendix: Normal.

Vascular/Lymphatic: Normal course and caliber of the major abdominal
vessels.

--No retroperitoneal lymphadenopathy.

--No mesenteric lymphadenopathy.

--No pelvic or inguinal lymphadenopathy.

Reproductive: Unremarkable

Other: No ascites or free air. The abdominal wall is normal.

Musculoskeletal. No acute displaced fractures.
IMPRESSION: 1. No acute abdominopelvic abnormality. Specifically, the appendix
is normal.
2. Cholelithiasis without acute inflammation.
3. Significantly distended stomach.

## 2024-03-04 DIAGNOSIS — J4520 Mild intermittent asthma, uncomplicated: Secondary | ICD-10-CM | POA: Diagnosis not present

## 2024-05-25 DIAGNOSIS — F4322 Adjustment disorder with anxiety: Secondary | ICD-10-CM | POA: Diagnosis not present

## 2024-05-31 DIAGNOSIS — F4322 Adjustment disorder with anxiety: Secondary | ICD-10-CM | POA: Diagnosis not present

## 2024-06-03 ENCOUNTER — Encounter: Payer: Self-pay | Admitting: Family Medicine

## 2024-06-03 ENCOUNTER — Ambulatory Visit: Admitting: Family Medicine

## 2024-06-03 VITALS — BP 138/88 | HR 78 | Resp 18 | Ht 76.0 in | Wt 314.0 lb

## 2024-06-03 DIAGNOSIS — Z23 Encounter for immunization: Secondary | ICD-10-CM

## 2024-06-03 DIAGNOSIS — Z0000 Encounter for general adult medical examination without abnormal findings: Secondary | ICD-10-CM | POA: Diagnosis not present

## 2024-06-03 DIAGNOSIS — Z114 Encounter for screening for human immunodeficiency virus [HIV]: Secondary | ICD-10-CM

## 2024-06-03 DIAGNOSIS — L910 Hypertrophic scar: Secondary | ICD-10-CM | POA: Diagnosis not present

## 2024-06-03 DIAGNOSIS — Z1159 Encounter for screening for other viral diseases: Secondary | ICD-10-CM

## 2024-06-03 DIAGNOSIS — M7652 Patellar tendinitis, left knee: Secondary | ICD-10-CM

## 2024-06-03 MED ORDER — ALBUTEROL SULFATE HFA 108 (90 BASE) MCG/ACT IN AERS: 2.0000 | INHALATION_SPRAY | RESPIRATORY_TRACT | 1 refills | Status: AC | PRN

## 2024-06-03 NOTE — Patient Instructions (Addendum)
 Give us  2-3 business days to get the results of your labs back.   Keep the diet clean and stay active.  If you do not hear anything about your referral in the next 1-2 weeks, call our office and ask for an update.  I recommend getting the flu shot in mid October. This suggestion would change if the CDC comes out with a different recommendation.   Do monthly self testicular checks in the shower. You are feeling for lumps/bumps that don't belong. If you feel anything like this, let me know!  Please get me a copy of your advanced directive form at your convenience.   Let us  know if you need anything.  Patellar Tendinitis Rehab Ask your health care provider which exercises are safe for you. Do exercises exactly as told by your health care provider and adjust them as directed. It is normal to feel mild stretching, pulling, tightness, or discomfort as you do these exercises, but you should stop right away if you feel sudden pain or your pain gets worse. Do not begin these exercises until told by your health care provider. Stretching and range of motion exercises This exercise warms up your muscles and joints and improves the movement and flexibility of your knee. This exercise also helps to relieve pain and stiffness. Exercise A: Hamstring, doorway  Lie on your back in front of a doorway with your R leg resting against the wall and your other leg flat on the floor in the doorway. There should be a slight bend in your knee. Straighten your knee. You should feel a stretch behind your knee or thigh. If you do not, scoot your buttocks closer to the door. Hold this position for 30 seconds. Repeat2 times. Complete this stretch 3 times per week. Strengthening exercises These exercises build strength and endurance in your knee. Endurance is the ability to use your muscles for a long time, even after they get tired. Exercise B: Quadriceps, isometric  Lie on your back with your R leg extended and your  other knee bent. Slowly tense the muscles in the front of your  thigh. When you do this, you should see your kneecap slide up toward your hip or see increased dimpling just above the knee. This motion will push the back of your knee toward the floor. If this is painful, try putting a rolled-up hand towel under your knee to support it in a bent position. Change the size of the towel to find a position that allows you to do this exercise without any pain. For 3 seconds, hold the muscle as tight as you can without increasing your pain. Relax the muscles slowly and completely. Repeat 2 times. Complete this exercise 3 times per week. Exercise C: Straight leg raises ( quadriceps) Lie on your back with your R leg extended and your other knee bent. Tense the muscles in the front of your R thigh. When you do this, you should see your kneecap slide up or see increased dimpling just above the knee. Keep these muscles tight as you raise your leg 4-6 inches (10-15 cm) off the floor. Do not let your moving knee bend. Hold this position for 1 second. Keep these muscles tense as you slowly lower your leg. Relax your muscles slowly and completely. Repeat 2 times. Complete this exercise 3 times per week Exercise D: Squats Stand in front of a table, with your feet and knees pointing straight ahead. You may rest your hands on the table for  balance but not for support. Slowly bend your knees and lower your hips like you are going to sit in a chair. Keep your weight over your heels, not over your toes. Keep your lower legs upright so they are parallel with the table legs. Do not let your hips go lower than your knees. Do not bend lower than told by your health care provider. If your knee pain increases, do not bend as low. Hold the squat position for 1 seconds. Slowly push with your legs to return to standing. Do not use your hands to pull yourself to standing. Repeat 2 times. Complete this exercise 3 times per  week. Exercise E: Step-downs Stand on the edge of a step. Keeping your weight over your R heel, slowly bend your R knee to bring your R heel toward the floor. Lower your heel as far as you can while keeping control and without increasing any discomfort. Do not let your knee come forward. Use your leg muscles, not gravity, to lower your body. Hold a wall or rail for balance if needed. Slowly push through your heel to lift your body weight back up. Return to the starting position. Repeat2 times. Complete this exercise 3 times per week. Exercise F: Straight leg raises ( hip abductors) Lie on your side with your R leg in the top position. Lie so your head, shoulder, knee, and hip line up. You may bend your lower knee to help you keep your balance. Roll your hips slightly forward, so that your hips are stacked directly over each other and your R knee is facing forward. Leading with your heel, lift your top leg 4-6 inches (10-15 cm). You should feel the muscles in your outer hip lifting. Do not let your foot drift forward. Do not let your knee roll toward the ceiling. Hold this position for 3 seconds. Slowly lower your leg to the starting position. Let your muscles relax completely after each repetition. Repeat 2 times. Complete this exercise 3 times per week. This information is not intended to replace advice given to you by your health care provider. Make sure you discuss any questions you have with your health care provider. Document Released: 09/24/2005 Document Revised: 05/31/2016 Document Reviewed: 06/28/2015 Elsevier Interactive Patient Education  [employer].

## 2024-06-03 NOTE — Progress Notes (Signed)
 Chief Complaint  Patient presents with   Establish Care    Well Male Leroy Adkins is here for a complete physical.   His last physical was >1 year ago.  Current diet: in general, a healthy diet.   Current exercise: MMA training Weight trend: had gained some Fatigue out of ordinary? No. Seat belt? Yes.   Advanced directive? No  Health maintenance Tetanus- No HIV- No Hep C- No  Patient had a piercing of his right ear in college around 8 years ago.  Since then, he has had a large keloid.  There is no pain or drainage.  He would like to have it taken care of.  The patient came down after jumping while playing basketball 18 months ago.  He felt a sharp pain just below his left kneecap.  No redness, swelling, or neurologic signs or symptoms.  No decreased range of motion.  It never fully improved.  He has not tried any thing at home so far.  Past Medical History:  Diagnosis Date   Asthma      Past Surgical History:  Procedure Laterality Date   NO PAST SURGERIES      Medications  Takes no meds routinely.    Allergies Allergies  Allergen Reactions   Penicillins     Family History Family History  Problem Relation Age of Onset   Diabetes Mother    Cancer Neg Hx     Review of Systems: Constitutional: no fevers or chills Eye:  no recent significant change in vision Ear/Nose/Mouth/Throat:  Ears:  no hearing loss Nose/Mouth/Throat:  no complaints of nasal congestion, no sore throat Cardiovascular:  no chest pain Respiratory:  no shortness of breath Gastrointestinal:  no abdominal pain, no change in bowel habits GU:  Male: negative for dysuria Musculoskeletal/Extremities:  +L knee pain Integumentary (Skin/Breast):  + Keloid on R ear Neurologic:  no headaches Endocrine: No unexpected weight changes Hematologic/Lymphatic:  no night sweats  Exam BP 138/88   Pulse 78   Resp 18   Ht 6' 4 (1.93 m)   Wt (!) 314 lb (142.4 kg)   SpO2 99%   BMI 38.22 kg/m  General:   well developed, well nourished, in no apparent distress Skin: Large cartilaginous structure over the right lobe of the ear.  No TTP, erythema, excessive warmth, or drainage.  Otherwise no significant moles, warts, or growths Head:  no masses, lesions, or tenderness Eyes:  pupils equal and round, sclera anicteric without injection Ears:  canals without lesions, TMs shiny without retraction, no obvious effusion, no erythema Nose:  nares patent, mucosa normal Throat/Pharynx:  lips and gingiva without lesion; tongue and uvula midline; non-inflamed pharynx; no exudates or postnasal drainage Neck: neck supple without adenopathy, thyromegaly, or masses Lungs:  clear to auscultation, breath sounds equal bilaterally, no respiratory distress Cardio:  regular rate and rhythm, no bruits, no LE edema Abdomen:  abdomen soft, nontender; bowel sounds normal; no masses or organomegaly Genital (male): Deferred Rectal: Deferred Musculoskeletal: Left knee with normal active and passive range of motion.  No edema or effusion.  There is TTP over the patellar tendon.  No bony tenderness or joint line tenderness.  Negative Lachman's, Stines, varus/valgus stress.  Symmetrical muscle groups noted without atrophy or deformity Extremities:  no clubbing, cyanosis, or edema, no deformities, no skin discoloration Neuro:  gait normal; deep tendon reflexes normal and symmetric Psych: well oriented with normal range of affect and appropriate judgment/insight  Assessment and Plan  Well adult exam -  Plan: CBC, Comprehensive metabolic panel with GFR, Lipid panel, Hemoglobin A1c  Screening for HIV without presence of risk factors - Plan: HIV Antibody (routine testing w rflx)  Encounter for hepatitis C screening test for low risk patient - Plan: Hepatitis C antibody  Keloid - Plan: Ambulatory referral to Dermatology  Patellar tendinitis of left knee  Need for Tdap vaccination - Plan: Tdap vaccine greater than or equal to 7yo  IM  Need for pneumococcal vaccination - Plan: Pneumococcal conjugate vaccine 20-valent (Prevnar 20)   Well 28 y.o. male. Counseled on diet and exercise. Self testicular exams recommended at least monthly.  Tdap and PCV 20 updated.  Albuterol  refilled. Patellar tendinitis: Chronic issue that is not currently controlled.  Heat, ice, Tylenol , stretches exercises provided.  Consider physical therapy versus referral to specialist if no improvement. Keloid: Refer to dermatology for this. Other orders as above. Follow up in 1 year pending the above workup. The patient voiced understanding and agreement to the plan.  Mabel Mt [place], DO 06/03/24 5:07 PM

## 2024-06-04 ENCOUNTER — Ambulatory Visit: Payer: Self-pay | Admitting: Family Medicine

## 2024-06-04 DIAGNOSIS — R748 Abnormal levels of other serum enzymes: Secondary | ICD-10-CM

## 2024-06-04 LAB — COMPREHENSIVE METABOLIC PANEL WITH GFR
ALT: 53 U/L (ref 0–53)
AST: 47 U/L — ABNORMAL HIGH (ref 0–37)
Albumin: 4.5 g/dL (ref 3.5–5.2)
Alkaline Phosphatase: 74 U/L (ref 39–117)
BUN: 18 mg/dL (ref 6–23)
CO2: 28 meq/L (ref 19–32)
Calcium: 9.6 mg/dL (ref 8.4–10.5)
Chloride: 101 meq/L (ref 96–112)
Creatinine, Ser: 1.13 mg/dL (ref 0.40–1.50)
GFR: 89.04 mL/min (ref >60.00)
Glucose, Bld: 119 mg/dL — ABNORMAL HIGH (ref 70–99)
Potassium: 3.6 meq/L (ref 3.5–5.1)
Sodium: 140 meq/L (ref 135–145)
Total Bilirubin: 0.5 mg/dL (ref 0.2–1.2)
Total Protein: 8.2 g/dL (ref 6.0–8.3)

## 2024-06-04 LAB — CBC
HCT: 43.9 % (ref 39.0–52.0)
Hemoglobin: 14.6 g/dL (ref 13.0–17.0)
MCHC: 33.4 g/dL (ref 30.0–36.0)
MCV: 92.7 fl (ref 78.0–100.0)
Platelets: 263 K/uL (ref 150.0–400.0)
RBC: 4.73 Mil/uL (ref 4.22–5.81)
RDW: 12.9 % (ref 11.5–15.5)
WBC: 6.2 K/uL (ref 4.0–10.5)

## 2024-06-04 LAB — HEPATITIS C ANTIBODY: Hepatitis C Ab: NONREACTIVE

## 2024-06-04 LAB — LIPID PANEL
Cholesterol: 181 mg/dL (ref 0–200)
HDL: 43 mg/dL (ref >39.00)
LDL Cholesterol: 119 mg/dL — ABNORMAL HIGH (ref 0–99)
NonHDL: 137.5
Total CHOL/HDL Ratio: 4
Triglycerides: 93 mg/dL (ref 0.0–149.0)
VLDL: 18.6 mg/dL (ref 0.0–40.0)

## 2024-06-04 LAB — HEMOGLOBIN A1C: Hgb A1c MFr Bld: 6.2 % (ref 4.6–6.5)

## 2024-06-04 LAB — HIV ANTIBODY (ROUTINE TESTING W REFLEX): HIV 1&2 Ab, 4th Generation: NONREACTIVE

## 2024-06-07 DIAGNOSIS — F4322 Adjustment disorder with anxiety: Secondary | ICD-10-CM | POA: Diagnosis not present

## 2024-06-11 ENCOUNTER — Encounter: Payer: Self-pay | Admitting: Dermatology

## 2024-06-11 ENCOUNTER — Other Ambulatory Visit: Payer: Self-pay

## 2024-06-11 ENCOUNTER — Ambulatory Visit: Admitting: Dermatology

## 2024-06-11 DIAGNOSIS — L910 Hypertrophic scar: Secondary | ICD-10-CM | POA: Diagnosis not present

## 2024-06-11 DIAGNOSIS — R239 Unspecified skin changes: Secondary | ICD-10-CM

## 2024-06-11 DIAGNOSIS — Z7189 Other specified counseling: Secondary | ICD-10-CM

## 2024-06-11 NOTE — Progress Notes (Signed)
   New Patient Visit   Subjective  Leroy Adkins is a 28 y.o. male who presents for the following: Patient c/o a growth on his right ear lobe for ~ 6 years, started when had his right ear pierced, growth is  growing and irritating, painful when he sleep on his right side.   The following portions of the chart were reviewed this encounter and updated as appropriate: medications, allergies, medical history  Review of Systems:  No other skin or systemic complaints except as noted in HPI or Assessment and Plan.  Objective  Well appearing patient in no apparent distress; mood and affect are within normal limits.  A focused examination was performed of the following areas: Right ear   Relevant exam findings are noted in the Assessment and Plan.           Assessment & Plan   KELOID Right earlobe -site of past pierced ear Symptomatic and growing.  Pt desires treatment Exam shows  ~ 4 cm Firm nodule of earlobe.  Most is on posterior lobe, but some anterior lobe.  Treatment Plan: Recommend surgery excision removal, followed by radiation treatment  We will send a referral to Dr Marcey CANDIE Penton -radiation department at [facility]   In the future plan possible topical steroids, topical imiquimod and/or ILK +/-5FU  treatment    Advised patient he will need close follow-up.  Advised the patient extensively that no matter what we do to treat this, he can potentially get regrowth/recurrence of keloid.  Patient understands. He is also advised he has high risk of development of other keloids especially at sites of injury or trauma.   Dr. Raymund evaluated patient today with me.  We will schedule patient for surgical excision of the keloid with Dr. Raymund and coordinate with immediate radiation to the earlobe excision site after surgery. (I, Dr. Hester, and currently not doing surgery since my cervical spinal disc surgery in early 2025.)   Return in about 4 weeks (around  07/09/2024) for surgery with Dr Raymund .  IFay Kirks, CMA, am acting as scribe for Alm Hester, MD .   Documentation: I have reviewed the above documentation for accuracy and completeness, and I agree with the above.  Alm Hester, MD

## 2024-06-11 NOTE — Patient Instructions (Signed)

## 2024-06-18 ENCOUNTER — Ambulatory Visit
Admission: RE | Admit: 2024-06-18 | Discharge: 2024-06-18 | Disposition: A | Discharge status: Home / Self Care | Source: Ambulatory Visit | Attending: Radiation Oncology | Admitting: Radiation Oncology

## 2024-06-18 ENCOUNTER — Encounter: Payer: Self-pay | Admitting: Radiation Oncology

## 2024-06-18 VITALS — BP 145/81 | HR 67 | Temp 98.2°F | Resp 16 | Wt 317.0 lb

## 2024-06-18 DIAGNOSIS — L910 Hypertrophic scar: Secondary | ICD-10-CM | POA: Diagnosis not present

## 2024-06-18 DIAGNOSIS — J45909 Unspecified asthma, uncomplicated: Secondary | ICD-10-CM | POA: Insufficient documentation

## 2024-06-18 NOTE — Consult Note (Signed)
 NEW PATIENT EVALUATION  Name: Leroy Adkins  MRN: [id number]  Date:   06/18/2024     DOB: 04/20/1996   This 28 y.o. male patient presents to the clinic for initial evaluation of keloid of the right earlobe.  REFERRING PHYSICIAN: Frann Mabel Mt*  CHIEF COMPLAINT:  Chief Complaint  Patient presents with   Scar    DIAGNOSIS: The encounter diagnosis was Hypertrophic scar of skin.   PREVIOUS INVESTIGATIONS:  Clinical notes reviewed  HPI: Patient is a 28 year old male with a 4 cm keloid of the right earlobe.  Keloid started approximately 8 years ago when he had a piercing of his right ear.  He is seeing Dr. Hester is scheduled for October 7 surgical removal seen today for postoperative radiation therapy to prevent keloid reformation  PLANNED TREATMENT REGIMEN: Electron-beam therapy  PAST MEDICAL HISTORY:  has a past medical history of Asthma.    PAST SURGICAL HISTORY:  Past Surgical History:  Procedure Laterality Date   NO PAST SURGERIES      FAMILY HISTORY: family history includes Diabetes in his mother.  SOCIAL HISTORY:  reports that he has never smoked. He has never used smokeless tobacco. He reports current alcohol use. He reports that he does not use drugs.  ALLERGIES: Penicillins  MEDICATIONS:  Current Outpatient Medications  Medication Sig Dispense Refill   albuterol  (VENTOLIN  HFA) 108 (90 Base) MCG/ACT inhaler Inhale 2 puffs into the lungs every 4 (four) hours as needed for wheezing or shortness of breath. 8 g 1   No current facility-administered medications for this encounter.    ECOG PERFORMANCE STATUS:  0 - Asymptomatic  REVIEW OF SYSTEMS: Patient denies any weight loss, fatigue, weakness, fever, chills or night sweats. Patient denies any loss of vision, blurred vision. Patient denies any ringing  of the ears or hearing loss. No irregular heartbeat. Patient denies heart murmur or history of fainting. Patient denies any chest pain or pain radiating to  her upper extremities. Patient denies any shortness of breath, difficulty breathing at night, cough or hemoptysis. Patient denies any swelling in the lower legs. Patient denies any nausea vomiting, vomiting of blood, or coffee ground material in the vomitus. Patient denies any stomach pain. Patient states has had normal bowel movements no significant constipation or diarrhea. Patient denies any dysuria, hematuria or significant nocturia. Patient denies any problems walking, swelling in the joints or loss of balance. Patient denies any skin changes, loss of hair or loss of weight. Patient denies any excessive worrying or anxiety or significant depression. Patient denies any problems with insomnia. Patient denies excessive thirst, polyuria, polydipsia. Patient denies any swollen glands, patient denies easy bruising or easy bleeding. Patient denies any recent infections, allergies or URI. Patient s visual fields have not changed significantly in recent time.   PHYSICAL EXAM: BP (!) 145/81   Pulse 67   Temp 98.2 F (36.8 C)   Resp 16   Wt (!) 317 lb (143.8 kg)   BMI 38.59 kg/m  Patient has a 4 cm multilobulated keloid of the right earlobe.  No other keloids are noted.  No evidence of adenopathy in the head and neck region is noted.  Well-developed well-nourished patient in NAD. HEENT reveals PERLA, EOMI, discs not visualized.  Oral cavity is clear. No oral mucosal lesions are identified. Neck is clear without evidence of cervical or supraclavicular adenopathy. Lungs are clear to A&P. Cardiac examination is essentially unremarkable with regular rate and rhythm without murmur rub or thrill. Abdomen is benign  with no organomegaly or masses noted. Motor sensory and DTR levels are equal and symmetric in the upper and lower extremities. Cranial nerves II through XII are grossly intact. Proprioception is intact. No peripheral adenopathy or edema is identified. No motor or sensory levels are noted. Crude visual  fields are within normal range.  LABORATORY DATA: Current labs reviewed    RADIOLOGY RESULTS: No current films for review   IMPRESSION: Keloid of the right earlobe in 28 year old male  PLAN: At this time we will go ahead with postoperative radiation therapy using electron beam.  Will plan on delivering 18 Gray in 3 fractions spaced a day apart.  I have personally 7 ordered CT simulation 2 days after his surgical excision.  Risks and benefits of treatment including skin reaction possible slight fatigue all were reviewed in detail with the patient.  Based on the size of lesion I think the 18 Gray in 3 fractions would be the best dose schedule.  Patient comprehends my recommendations well.  I would like to take this opportunity to thank you for allowing me to participate in the care of your patient.SABRA Marcey Penton, MD

## 2024-06-30 DIAGNOSIS — L910 Hypertrophic scar: Secondary | ICD-10-CM | POA: Diagnosis not present

## 2024-07-13 ENCOUNTER — Ambulatory Visit
Admission: RE | Admit: 2024-07-13 | Discharge: 2024-07-13 | Disposition: A | Discharge status: Home / Self Care | Source: Ambulatory Visit | Attending: Radiation Oncology | Admitting: Radiation Oncology

## 2024-07-13 DIAGNOSIS — L910 Hypertrophic scar: Secondary | ICD-10-CM | POA: Insufficient documentation

## 2024-07-13 DIAGNOSIS — J45909 Unspecified asthma, uncomplicated: Secondary | ICD-10-CM | POA: Diagnosis not present

## 2024-07-14 ENCOUNTER — Ambulatory Visit (INDEPENDENT_AMBULATORY_CARE_PROVIDER_SITE_OTHER)

## 2024-07-14 ENCOUNTER — Telehealth: Payer: Self-pay

## 2024-07-14 DIAGNOSIS — L910 Hypertrophic scar: Secondary | ICD-10-CM

## 2024-07-14 NOTE — Telephone Encounter (Signed)
 Called patient to follow up from surgery this morning. No answer, left message to call back with questions of concerns.

## 2024-07-14 NOTE — Progress Notes (Signed)
   Follow-Up Visit   Subjective  Leroy Adkins is a 28 y.o. male who presents for the following: Keloids of the R ear, pt here today for surgical excision followed by radiation at [facility].   The following portions of the chart were reviewed this encounter and updated as appropriate: medications, allergies, medical history  Review of Systems:  No other skin or systemic complaints except as noted in HPI or Assessment and Plan.  Objective  Well appearing patient in no apparent distress; mood and affect are within normal limits.   A focused examination was performed of the following areas: the face and ears   Relevant exam findings are noted in the Assessment and Plan.  R ear posterior 2.0 cm Firm pink/brown dermal papule(s)/plaque(s). Right Ear Anterior 1.0 cm Firm pink/brown dermal papule(s)/plaque(s).  Assessment & Plan   KELOID (2) R ear posterior, Right Ear Anterior Skin excision - Right Ear Anterior  Excision method:  elliptical Lesion length (cm):  1 Margin per side (cm):  0 Total excision diameter (cm):  1 Informed consent: discussed and consent obtained   Timeout: patient name, date of birth, surgical site, and procedure verified   Procedure prep:  Patient was prepped and draped in usual sterile fashion Prep type:  Chlorhexidine Anesthesia: the lesion was anesthetized in a standard fashion   Anesthetic:  1% lidocaine w/ epinephrine 1-100,000 buffered w/ 8.4% NaHCO3 (8CC total) Instrument used: #15 blade   Hemostasis achieved with: pressure and electrodesiccation   Outcome: patient tolerated procedure well with no complications    Skin excision - R ear posterior  Excision method:  elliptical Total excision diameter (cm):  2 Informed consent: discussed and consent obtained   Timeout: patient name, date of birth, surgical site, and procedure verified   Procedure prep:  Patient was prepped and draped in usual sterile fashion Prep type:   Chlorhexidine Anesthesia: the lesion was anesthetized in a standard fashion   Anesthetic:  1% lidocaine w/ epinephrine 1-100,000 buffered w/ 8.4% NaHCO3 Instrument used: #15 blade   Hemostasis achieved with: pressure and electrodesiccation   Outcome: patient tolerated procedure well with no complications   Post-procedure details: sterile dressing applied and wound care instructions given   Dressing type: bacitracin, bandage and pressure dressing    Specimen 1 - Surgical pathology Differential Diagnosis: r/o keloid  Check Margins: No  Return in about 3 months (around 10/14/2024) for keloid follow up.  LILLETTE Rosina Mayans, CMA, am acting as scribe for Lauraine JAYSON Kanaris, MD .   Documentation: I have reviewed the above documentation for accuracy and completeness, and I agree with the above.  Lauraine JAYSON Kanaris, MD

## 2024-07-14 NOTE — Patient Instructions (Signed)
Wound Care Instructions for After Surgery  On the day following your surgery, you should begin doing daily dressing changes until your sutures are removed:  Remove the bandage.  Cleanse the wound gently with soap and water.   Make sure you then dry the skin surrounding the wound completely or the tape will not stick to the skin. Do not use cotton balls on the wound.  After the wound is clean and dry, apply the ointment (either prescription antibiotic prescribed by your doctor or plain Vaseline if nothing was prescribed) gently with a Q-tip.  If you are using a bandaid to cover:  Apply a bandaid large enough to cover the entire wound.  If you do not have a bandaid large enough to cover the wound OR if you are sensitive to bandaid adhesive:  Cut a non-stick pad (such as Telfa) to fit the size of the wound.   Cover the wound with the non-stick pad.  If the wound is draining, you may want to add a small amount of gauze on top of the non-stick pad for a little added compression to the area.  Use tape to seal the area completely.  For the next 1-2 weeks:  Be sure to keep the wound moist with ointment 24/7 to ensure best healing. If you are unable to cover the wound with a bandage to hold the ointment in place, you may need to reapply the ointment several times a day.  Do not bend over or lift heavy items to reduce the chance of elevated blood pressure to the wound.  Do not participate in particularly strenuous activities.  Below is a list of dressing supplies you might need.   Cotton-tipped applicators - Q-tips  Gauze pads (2x2 and/or 4x4) - All-Purpose Sponges  New and clean tube of petroleum jelly (Vaseline) OR prescription antibiotic ointment if prescribed  Either a bandaid large enough to cover the entire wound OR non-stick dressing material (Telfa) and Tape (Paper or Hypafix)  FOR ADULT SURGERY PATIENTS: If you need something for pain relief, you may take 1 extra strength  Tylenol (acetaminophen) and 2 ibuprofen (200 mg) together every 4 hours as needed. (Do not take these medications if you are allergic to them or if you know you cannot take them for any other reason). Typically you may only need pain medication for 1-3 days.   Comments on the Post-Operative Period Slight swelling and redness often appear around the wound. This is normal and will disappear within several days following the surgery. The healing wound will drain a brownish-red-yellow discharge during healing. This is a normal phase of wound healing. As the wound begins to heal, the drainage may increase in amount. Again, this drainage is normal. Notify us if the drainage becomes persistently bloody, excessively swollen, or intensely painful or develops a foul odor or red streaks.  The healing wound will also typically be itchy. This is normal. If you have severe or persistent pain, Notify us if the discomfort is severe or persistent. Avoid alcoholic beverages when taking pain medicine.  In Case of Wound Hemorrhage A wound hemorrhage is when the bandage suddenly becomes soaked with bright red blood and flows profusely. If this happens, sit down or lie down with your head elevated. If the wound has a dressing on it, do not remove the dressing. Apply pressure to the existing gauze. If the wound is not covered, use a gauze pad to apply pressure and continue applying the pressure for 20 minutes without   peeking. DO NOT COVER THE WOUND WITH A LARGE TOWEL OR WASH CLOTH. Release your hand from the wound site but do not remove the dressing. If the bleeding has stopped, gently clean around the wound. Leave the dressing in place for 24 hours if possible. This wait time allows the blood vessels to close off so that you do not spark a new round of bleeding by disrupting the newly clotted blood vessels with an immediate dressing change. If the bleeding does not subside, continue to hold pressure for 40 minutes. If bleeding  continues, page your physician, contact an After Hours clinic or go to the Emergency Room. ?  

## 2024-07-15 ENCOUNTER — Other Ambulatory Visit: Payer: Self-pay

## 2024-07-15 ENCOUNTER — Ambulatory Visit
Admission: RE | Admit: 2024-07-15 | Discharge: 2024-07-15 | Attending: Radiation Oncology | Admitting: Radiation Oncology

## 2024-07-15 DIAGNOSIS — L910 Hypertrophic scar: Secondary | ICD-10-CM | POA: Diagnosis not present

## 2024-07-15 LAB — RAD ONC ARIA SESSION SUMMARY
Course Elapsed Days: 0
Plan Fractions Treated to Date: 1
Plan Prescribed Dose Per Fraction: 6 Gy
Plan Total Fractions Prescribed: 3
Plan Total Prescribed Dose: 18 Gy
Reference Point Dosage Given to Date: 6 Gy
Reference Point Session Dosage Given: 6 Gy
Session Number: 1

## 2024-07-16 ENCOUNTER — Ambulatory Visit

## 2024-07-16 ENCOUNTER — Other Ambulatory Visit: Payer: Self-pay

## 2024-07-16 ENCOUNTER — Ambulatory Visit
Admission: RE | Admit: 2024-07-16 | Discharge: 2024-07-16 | Disposition: A | Discharge status: Home / Self Care | Source: Ambulatory Visit | Attending: Radiation Oncology | Admitting: Radiation Oncology

## 2024-07-16 DIAGNOSIS — J45909 Unspecified asthma, uncomplicated: Secondary | ICD-10-CM | POA: Diagnosis not present

## 2024-07-16 DIAGNOSIS — L910 Hypertrophic scar: Secondary | ICD-10-CM | POA: Diagnosis not present

## 2024-07-16 LAB — RAD ONC ARIA SESSION SUMMARY
Course Elapsed Days: 1
Plan Fractions Treated to Date: 2
Plan Prescribed Dose Per Fraction: 6 Gy
Plan Total Fractions Prescribed: 3
Plan Total Prescribed Dose: 18 Gy
Reference Point Dosage Given to Date: 12 Gy
Reference Point Session Dosage Given: 6 Gy
Session Number: 2

## 2024-07-16 LAB — SURGICAL PATHOLOGY

## 2024-07-17 ENCOUNTER — Other Ambulatory Visit: Payer: Self-pay

## 2024-07-17 ENCOUNTER — Ambulatory Visit
Admission: RE | Admit: 2024-07-17 | Discharge: 2024-07-17 | Disposition: A | Discharge status: Home / Self Care | Source: Ambulatory Visit | Attending: Radiation Oncology | Admitting: Radiation Oncology

## 2024-07-17 DIAGNOSIS — L910 Hypertrophic scar: Secondary | ICD-10-CM | POA: Diagnosis not present

## 2024-07-17 LAB — RAD ONC ARIA SESSION SUMMARY
Course Elapsed Days: 2
Plan Fractions Treated to Date: 3
Plan Prescribed Dose Per Fraction: 6 Gy
Plan Total Fractions Prescribed: 3
Plan Total Prescribed Dose: 18 Gy
Reference Point Dosage Given to Date: 18 Gy
Reference Point Session Dosage Given: 6 Gy
Session Number: 3

## 2024-07-20 ENCOUNTER — Ambulatory Visit: Payer: Self-pay

## 2024-07-21 NOTE — Telephone Encounter (Signed)
-----   Message from Lauraine JAYSON Kanaris sent at 07/20/2024  8:56 AM EDT -----      1. Skin (M), right ear :       KELOID   Please notify patient with below plan: - keloid - treated with excision and radiation - please schedule follow up with me in 6-8weeks  ----- Message ----- From: Interface, [facility] Sent: 07/16/2024   5:55 PM EDT To: Lauraine JAYSON Kanaris, MD

## 2024-07-21 NOTE — Telephone Encounter (Signed)
Left message on voicemail to return my call.  

## 2024-07-23 NOTE — Radiation Completion Notes (Signed)
 Patient Name: Leroy Adkins, Leroy Adkins MRN: [id number] Date of Birth: 05-11-1996 Referring Physician: MABEL PRY, M.D. Date of Service: 2024-07-23 Radiation Oncologist: Marcey Penton, M.D. 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  Keloid started approximately 8 years ago when he had a piercing of his right ear.  He is seeing Dr. Hester is scheduled for October 7 surgical removal seen today for postoperative radiation therapy to prevent keloid reformation      ==========DELIVERED PLANS==========  First Treatment Date: 2024-07-15 Last Treatment Date: 2024-07-17   Plan Name: [email or web address] Site: [place], Right Technique: Electron Mode: Electron Dose Per Fraction: 6 Gy Prescribed Dose (Delivered / Prescribed): 18 Gy / 18 Gy Prescribed Fxs (Delivered / Prescribed): 3 / 3     ==========ON TREATMENT VISIT DATES========== 2024-07-15     ==========UPCOMING VISITS========== 09/09/2024 [facility] SKIN CTR OFFICE VISIT Raymund Lauraine BROCKS, MD  08/17/2024 [facility] RAD ONCOLOGY FOLLOW UP 30 Penton Marcey, MD        ==========APPENDIX - ON TREATMENT VISIT NOTES==========   See weekly On Treatment Notes in Epic for details in the Media tab (listed as Progress notes on the On Treatment Visit Dates listed above).

## 2024-07-26 DIAGNOSIS — F4322 Adjustment disorder with anxiety: Secondary | ICD-10-CM | POA: Diagnosis not present

## 2024-07-28 ENCOUNTER — Ambulatory Visit: Admitting: Medical

## 2024-08-09 DIAGNOSIS — F4322 Adjustment disorder with anxiety: Secondary | ICD-10-CM | POA: Diagnosis not present

## 2024-08-14 DIAGNOSIS — F411 Generalized anxiety disorder: Secondary | ICD-10-CM | POA: Diagnosis not present

## 2024-08-14 DIAGNOSIS — F331 Major depressive disorder, recurrent, moderate: Secondary | ICD-10-CM | POA: Diagnosis not present

## 2024-08-14 DIAGNOSIS — Z630 Problems in relationship with spouse or partner: Secondary | ICD-10-CM | POA: Diagnosis not present

## 2024-08-17 ENCOUNTER — Encounter: Payer: Self-pay | Admitting: Radiation Oncology

## 2024-08-17 ENCOUNTER — Ambulatory Visit
Admission: RE | Admit: 2024-08-17 | Discharge: 2024-08-17 | Disposition: A | Discharge status: Home / Self Care | Source: Ambulatory Visit | Attending: Radiation Oncology | Admitting: Radiation Oncology

## 2024-08-17 VITALS — BP 145/85 | HR 85 | Temp 97.4°F | Resp 16 | Wt 325.6 lb

## 2024-08-17 DIAGNOSIS — L910 Hypertrophic scar: Secondary | ICD-10-CM | POA: Insufficient documentation

## 2024-08-17 DIAGNOSIS — Z923 Personal history of irradiation: Secondary | ICD-10-CM | POA: Diagnosis not present

## 2024-08-17 NOTE — Progress Notes (Signed)
 Radiation Oncology Follow up Note  Name: Leroy Adkins   Date:   08/17/2024 MRN:  [id number] DOB: 01/16/1996    This 28 y.o. male presents to the clinic today for 1 month follow-up status post electron-beam therapy to his right ear for keloid.  REFERRING PROVIDER: Frann Mabel Mt*  HPI: Patient is a 28 year old male now out 1 month having received postoperative treatments to his right earlobe for keloid status post resection.  He is seen today in follow-up doing well he has had an excellent cosmetic result no significant recurrence of his keloid is noted.  Skin is still somewhat hyperpigmented.  He is without complaint..  COMPLICATIONS OF TREATMENT: none  FOLLOW UP COMPLIANCE: keeps appointments   PHYSICAL EXAM:  BP (!) 145/85   Pulse 85   Temp (!) 97.4 F (36.3 C) (Tympanic)   Resp 16   Wt (!) 325 lb 9.6 oz (147.7 kg)   BMI 39.63 kg/m  Some slight thickening of the right earlobe no evidence of obvious progression of keloid.  Skin is still set somewhat hyperpigmented.  RADIOLOGY RESULTS: No current films for review  PLAN: Present time patient has done well.  I will turn follow-up care over to his surgeon.  I be happy to reevaluate the patient in time should any other lesions i.e. keloids develop.  Patient is to call with any concerns at any time.  I would like to take this opportunity to thank you for allowing me to participate in the care of your patient.SABRA Marcey Penton, MD

## 2024-08-21 DIAGNOSIS — F331 Major depressive disorder, recurrent, moderate: Secondary | ICD-10-CM | POA: Diagnosis not present

## 2024-08-21 DIAGNOSIS — Z630 Problems in relationship with spouse or partner: Secondary | ICD-10-CM | POA: Diagnosis not present

## 2024-08-21 DIAGNOSIS — F411 Generalized anxiety disorder: Secondary | ICD-10-CM | POA: Diagnosis not present

## 2024-08-22 DIAGNOSIS — F331 Major depressive disorder, recurrent, moderate: Secondary | ICD-10-CM | POA: Diagnosis not present

## 2024-08-22 DIAGNOSIS — F411 Generalized anxiety disorder: Secondary | ICD-10-CM | POA: Diagnosis not present

## 2024-08-22 DIAGNOSIS — Z630 Problems in relationship with spouse or partner: Secondary | ICD-10-CM | POA: Diagnosis not present

## 2024-08-26 DIAGNOSIS — F411 Generalized anxiety disorder: Secondary | ICD-10-CM | POA: Diagnosis not present

## 2024-08-26 DIAGNOSIS — Z630 Problems in relationship with spouse or partner: Secondary | ICD-10-CM | POA: Diagnosis not present

## 2024-08-26 DIAGNOSIS — F331 Major depressive disorder, recurrent, moderate: Secondary | ICD-10-CM | POA: Diagnosis not present

## 2024-09-01 DIAGNOSIS — Z630 Problems in relationship with spouse or partner: Secondary | ICD-10-CM | POA: Diagnosis not present

## 2024-09-01 DIAGNOSIS — F411 Generalized anxiety disorder: Secondary | ICD-10-CM | POA: Diagnosis not present

## 2024-09-01 DIAGNOSIS — F331 Major depressive disorder, recurrent, moderate: Secondary | ICD-10-CM | POA: Diagnosis not present

## 2024-09-09 ENCOUNTER — Ambulatory Visit

## 2024-09-11 DIAGNOSIS — F331 Major depressive disorder, recurrent, moderate: Secondary | ICD-10-CM | POA: Diagnosis not present

## 2024-09-11 DIAGNOSIS — Z630 Problems in relationship with spouse or partner: Secondary | ICD-10-CM | POA: Diagnosis not present

## 2024-09-11 DIAGNOSIS — F411 Generalized anxiety disorder: Secondary | ICD-10-CM | POA: Diagnosis not present

## 2024-09-14 DIAGNOSIS — F331 Major depressive disorder, recurrent, moderate: Secondary | ICD-10-CM | POA: Diagnosis not present

## 2024-09-14 DIAGNOSIS — Z630 Problems in relationship with spouse or partner: Secondary | ICD-10-CM | POA: Diagnosis not present

## 2024-09-14 DIAGNOSIS — F411 Generalized anxiety disorder: Secondary | ICD-10-CM | POA: Diagnosis not present

## 2024-09-17 DIAGNOSIS — F4322 Adjustment disorder with anxiety: Secondary | ICD-10-CM | POA: Diagnosis not present

## 2024-09-18 DIAGNOSIS — Z630 Problems in relationship with spouse or partner: Secondary | ICD-10-CM | POA: Diagnosis not present

## 2024-09-18 DIAGNOSIS — F411 Generalized anxiety disorder: Secondary | ICD-10-CM | POA: Diagnosis not present

## 2024-09-18 DIAGNOSIS — F331 Major depressive disorder, recurrent, moderate: Secondary | ICD-10-CM | POA: Diagnosis not present

## 2024-09-18 DIAGNOSIS — F900 Attention-deficit hyperactivity disorder, predominantly inattentive type: Secondary | ICD-10-CM | POA: Diagnosis not present
